# Patient Record
Sex: Male | Born: 1965
Health system: Southern US, Community
[De-identification: ages and names within clinical notes are randomized; demographics above are authoritative.]

## PROBLEM LIST (undated history)

## (undated) DIAGNOSIS — F32A Depression, unspecified: Secondary | ICD-10-CM

## (undated) DIAGNOSIS — Z87442 Personal history of urinary calculi: Secondary | ICD-10-CM

## (undated) DIAGNOSIS — F419 Anxiety disorder, unspecified: Secondary | ICD-10-CM

## (undated) DIAGNOSIS — E785 Hyperlipidemia, unspecified: Secondary | ICD-10-CM

## (undated) DIAGNOSIS — Z974 Presence of external hearing-aid: Secondary | ICD-10-CM

## (undated) HISTORY — PX: CHOLECYSTECTOMY: SHX55

## (undated) HISTORY — PX: TONSILLECTOMY: SUR1361

## (undated) HISTORY — PX: ADENOIDECTOMY: SUR15

---
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
## 2004-08-01 ENCOUNTER — Ambulatory Visit: Payer: Self-pay | Admitting: Urology

## 2005-09-12 ENCOUNTER — Ambulatory Visit: Payer: Self-pay | Admitting: Urology

## 2005-10-30 ENCOUNTER — Ambulatory Visit: Payer: Self-pay | Admitting: Urology

## 2005-12-07 ENCOUNTER — Ambulatory Visit: Payer: Self-pay | Admitting: Internal Medicine

## 2005-12-28 ENCOUNTER — Ambulatory Visit: Payer: Self-pay | Admitting: Internal Medicine

## 2006-01-11 ENCOUNTER — Ambulatory Visit: Payer: Self-pay | Admitting: Surgery

## 2006-01-29 ENCOUNTER — Emergency Department: Payer: Self-pay | Admitting: Emergency Medicine

## 2006-01-29 ENCOUNTER — Other Ambulatory Visit: Payer: Self-pay

## 2006-01-30 ENCOUNTER — Ambulatory Visit: Payer: Self-pay | Admitting: Emergency Medicine

## 2006-04-10 HISTORY — PX: CHOLECYSTECTOMY: SHX55

## 2007-07-21 ENCOUNTER — Ambulatory Visit: Payer: Self-pay | Admitting: Family Medicine

## 2007-08-10 ENCOUNTER — Ambulatory Visit: Payer: Self-pay | Admitting: Internal Medicine

## 2008-04-27 IMAGING — CR DG ABDOMEN 1V
1 series · 2 of 2 positions shown · non-contrast
Comparison: none

REASON FOR EXAM: Nephrolithiasis. Patient need films
COMMENTS:

[Series 1: view not recorded · 0.17mm/px · 2 of 2 slices shown]
[im 1/2]
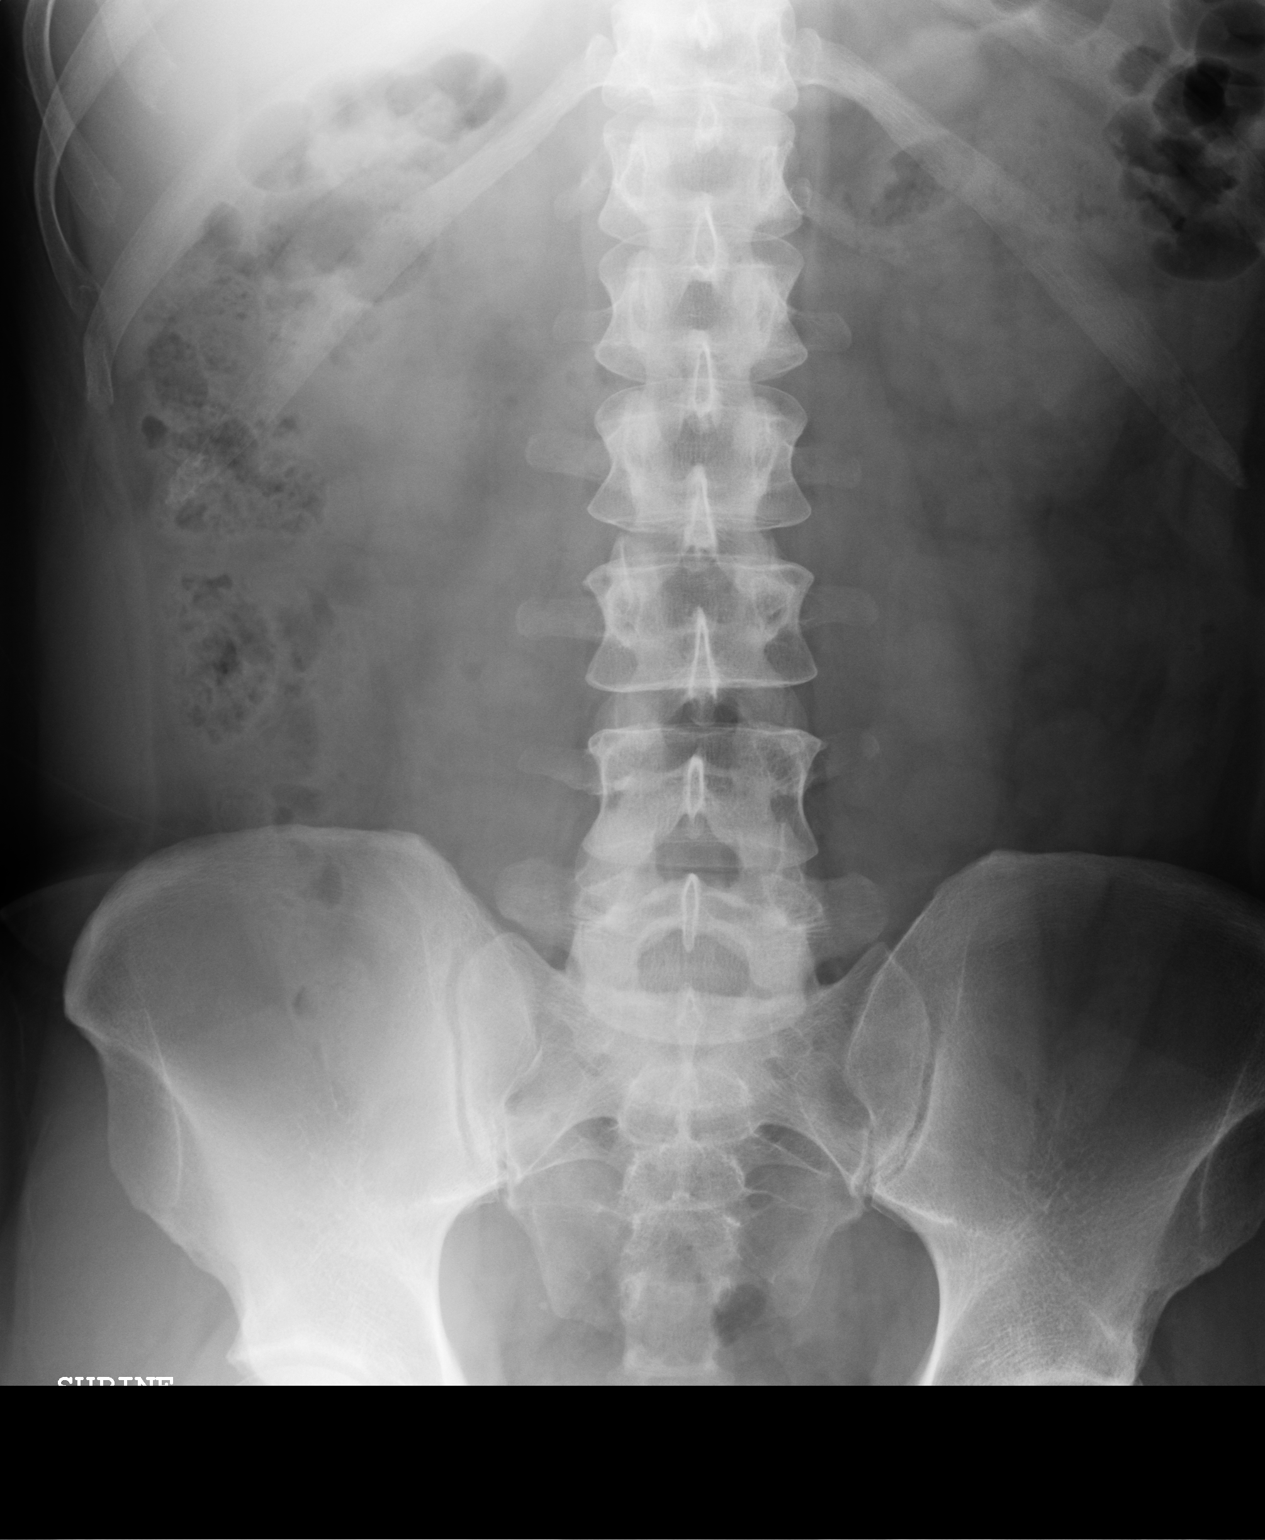
[im 2/2]
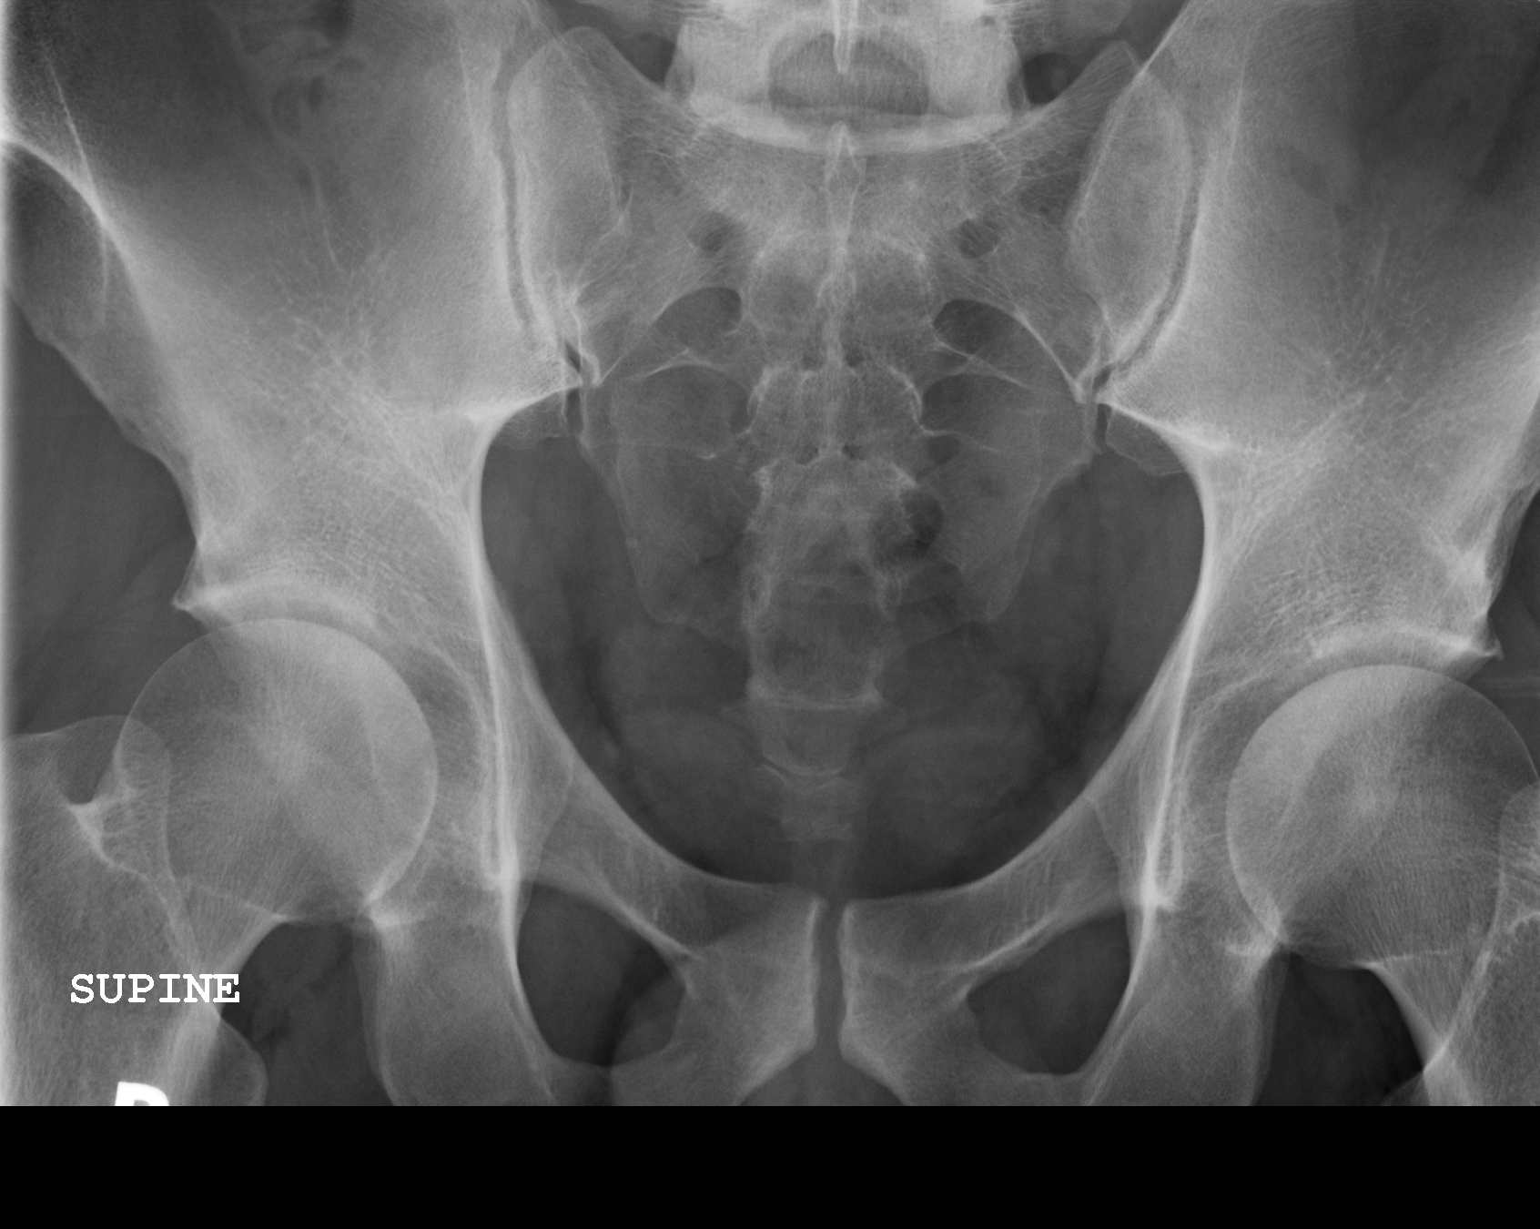

[2 of 2 positions shown; findings below may reference images not displayed]

PROCEDURE:     DXR - DXR KIDNEY URETER BLADDER  - October 30, 2005  [DATE]

RESULT:     AP view of the abdomen is compared to the prior exam of 09/12/05.
The bowel gas pattern is normal.  There is a moderate amount of fecal
material in the colon.  No definite renal or ureteral stones are identified
on plain film examination.
IMPRESSION: No specific abnormalities are identified.

## 2008-06-04 IMAGING — US ABDOMEN ULTRASOUND
1 series · 17 of 25 positions shown · non-contrast
Comparison: none

REASON FOR EXAM: RUQ abdominal pain
COMMENTS:

[Series 1: abdomen ultrasound · 17 of 48 slices shown]
[im 1/48]
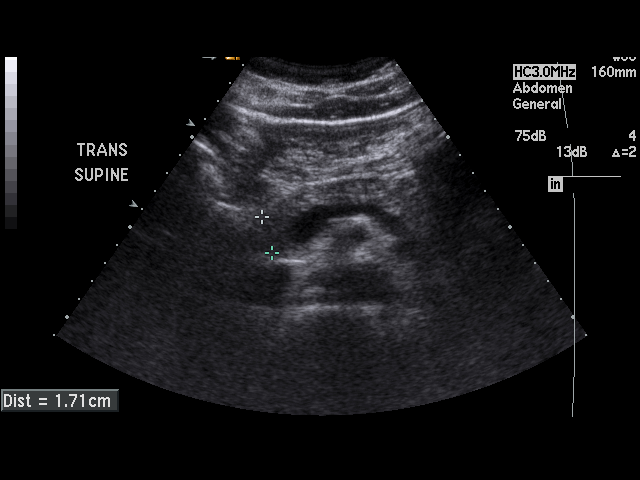
[im 4/48]
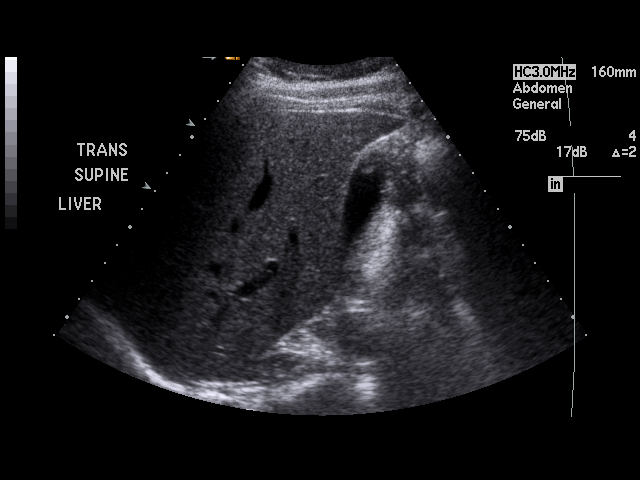
[im 6/48]
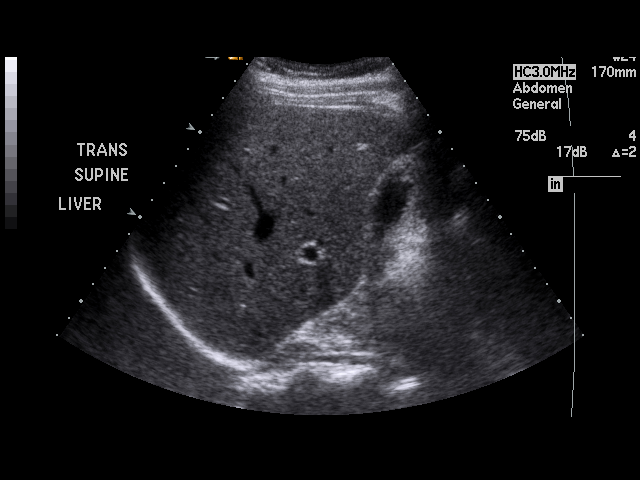
[im 10/48]
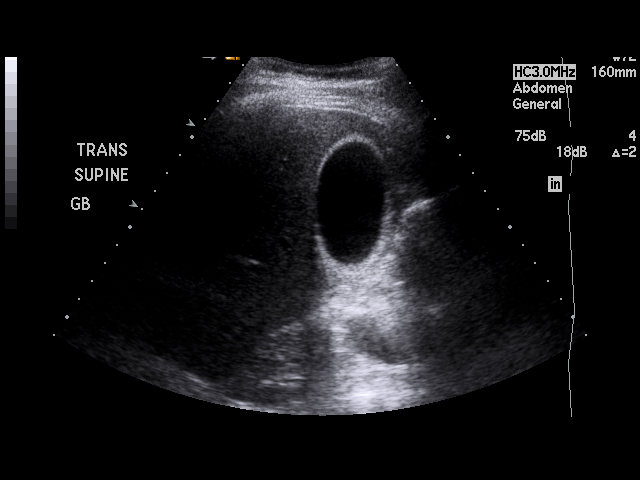
[im 12/48]
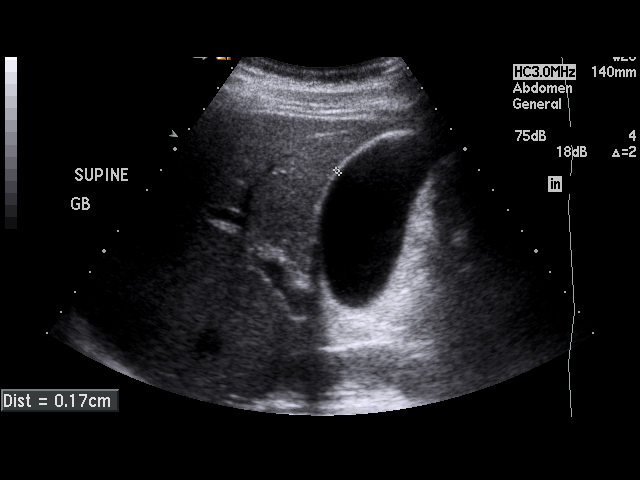
[im 16/48]
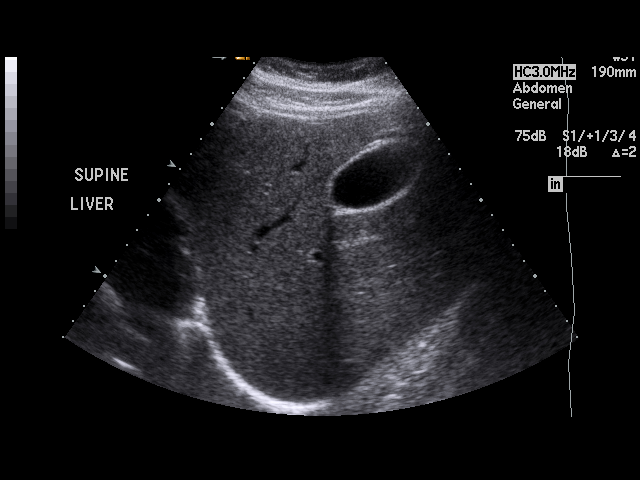
[im 18/48]
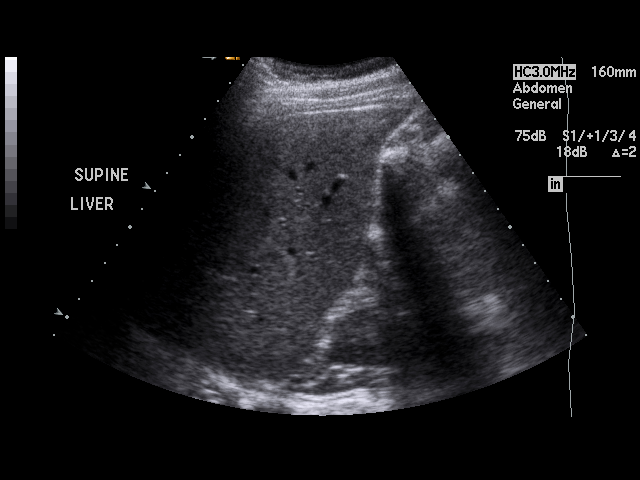
[im 22/48]
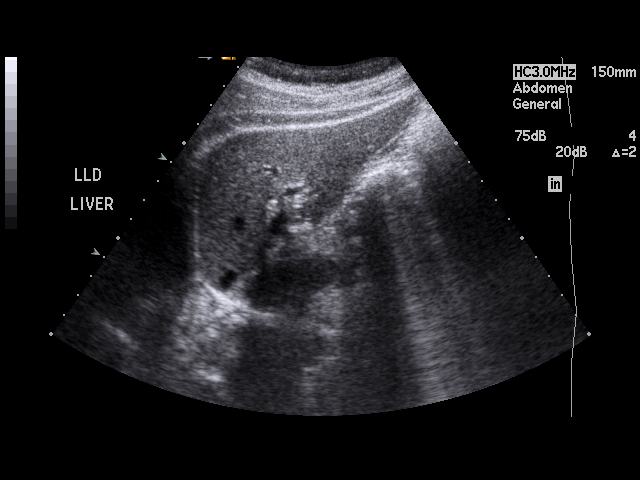
[im 24/48]
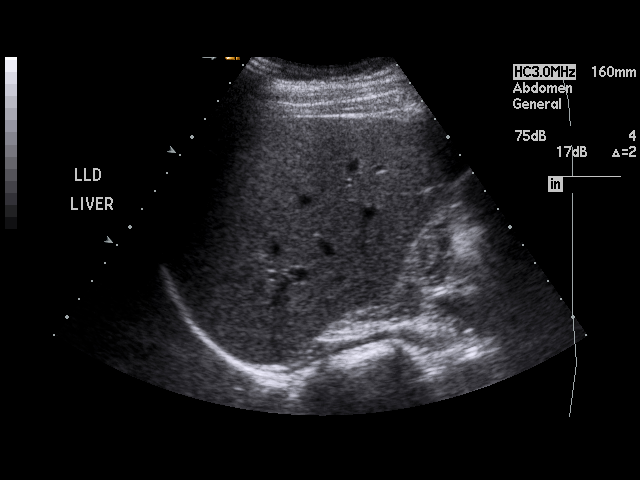
[im 26/48]
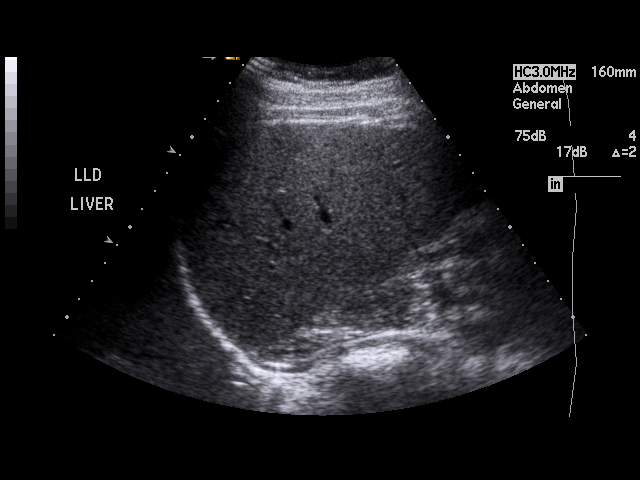
[im 30/48]
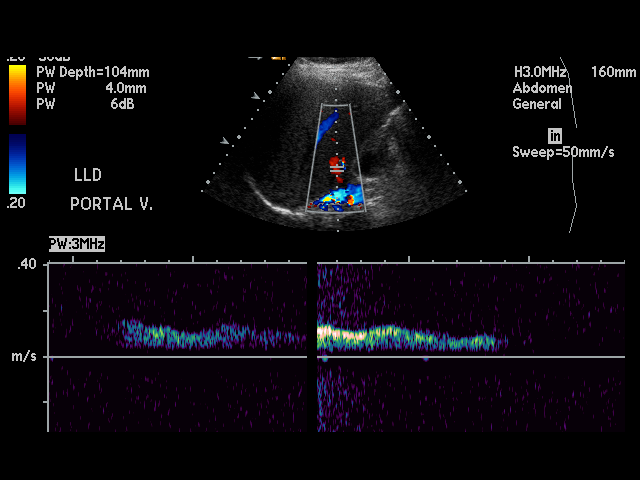
[im 32/48]
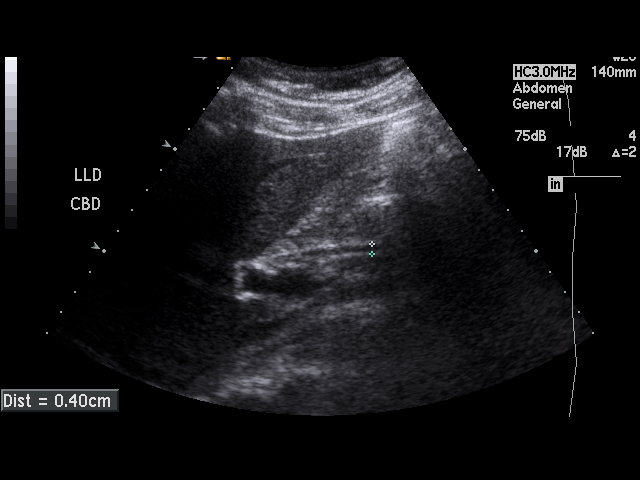
[im 36/48]
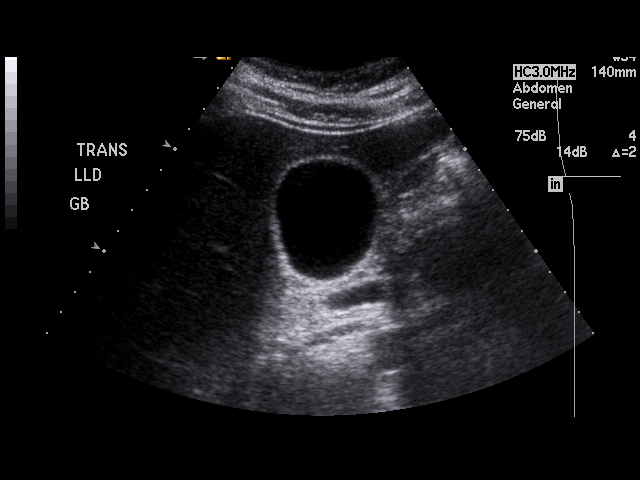
[im 38/48]
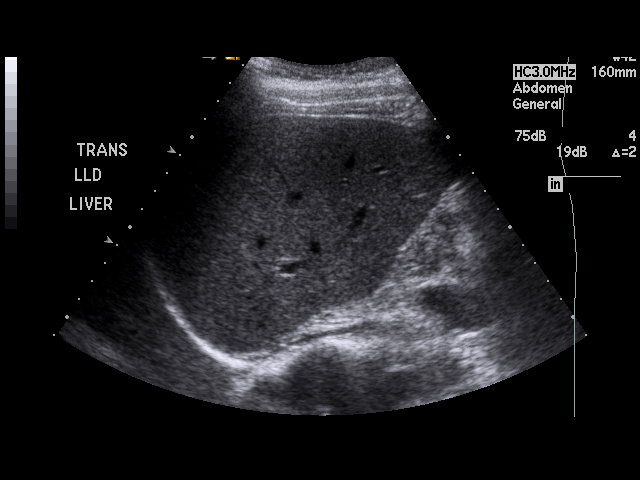
[im 42/48]
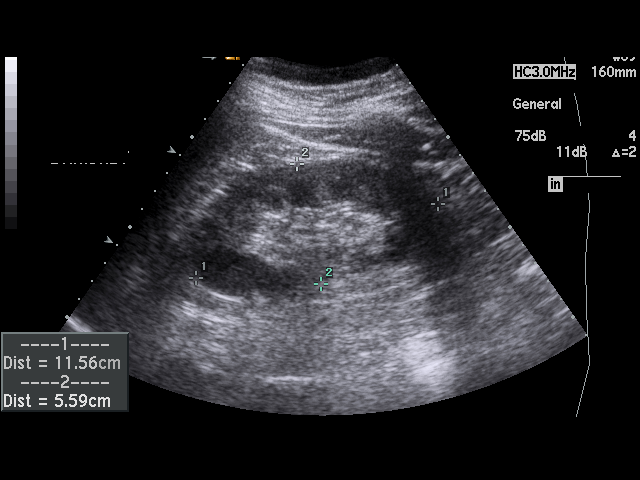
[im 44/48]
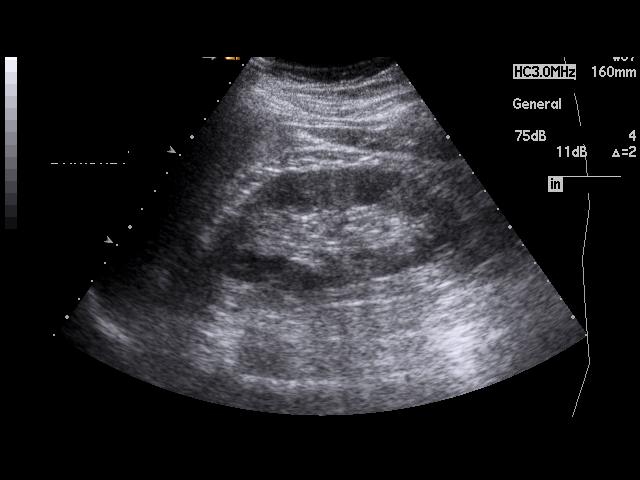
[im 48/48]
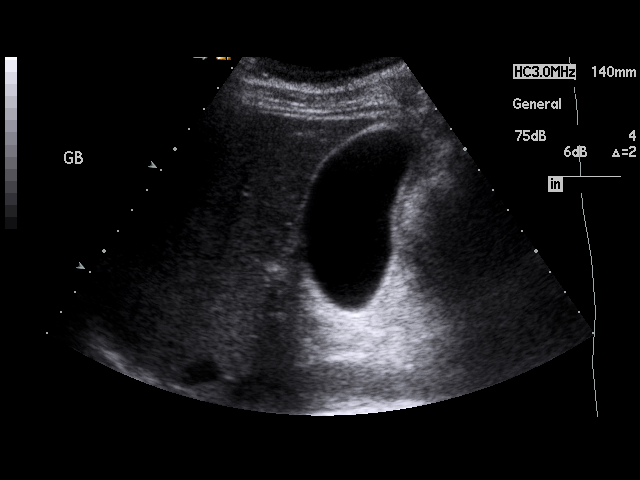

[17 of 25 positions shown; findings below may reference images not displayed]

PROCEDURE:     US  - US ABDOMEN GENERAL SURVEY  - December 07, 2005  [DATE]

RESULT:          The liver demonstrates a homogeneous echotexture.  The
common bile duct measures 4 mm in diameter.  There is no evidence of intra
or extrahepatic biliary ductal dilatation.  Hepatopetal flow is demonstrated
within the portal vein.  Evaluation of the gallbladder demonstrates no
evidence of pericholecystic fluids, stones, or sludging.  Gallbladder wall
thickness is 1.7 mm.  The RIGHT and LEFT kidneys demonstrate appropriate
corticomedullary differentiation without evidence of hydronephrosis, masses
or calculi.  The spleen is homogeneous in echotexture and measures 11.4 cm
in longitudinal dimensions.  The pancreas demonstrates a homogeneous
echotexture.  There is no evidence of abdominal aortic aneurysm.  The
maximal AP dimensions of the abdominal aorta are 1.5 cm.
IMPRESSION: Unremarkable abdominal ultrasound as described above.

## 2008-06-16 ENCOUNTER — Ambulatory Visit: Payer: Self-pay | Admitting: Internal Medicine

## 2008-07-21 ENCOUNTER — Ambulatory Visit: Payer: Self-pay | Admitting: Internal Medicine

## 2008-10-23 ENCOUNTER — Ambulatory Visit: Payer: Self-pay | Admitting: Urology

## 2009-01-15 ENCOUNTER — Ambulatory Visit: Payer: Self-pay | Admitting: Urology

## 2009-01-21 ENCOUNTER — Ambulatory Visit: Payer: Self-pay | Admitting: Urology

## 2009-11-10 ENCOUNTER — Ambulatory Visit: Payer: Self-pay | Admitting: General Practice

## 2011-10-03 ENCOUNTER — Ambulatory Visit: Payer: Self-pay | Admitting: Urology

## 2011-10-05 ENCOUNTER — Ambulatory Visit: Payer: Self-pay | Admitting: Urology

## 2012-03-11 ENCOUNTER — Other Ambulatory Visit: Payer: Self-pay | Admitting: Family

## 2012-06-06 ENCOUNTER — Ambulatory Visit: Payer: Self-pay | Admitting: Internal Medicine

## 2014-04-02 ENCOUNTER — Ambulatory Visit: Payer: Self-pay | Admitting: Physician Assistant

## 2014-04-02 LAB — DOT URINE DIP
Blood: NEGATIVE
Glucose,UR: NEGATIVE
PROTEIN: NEGATIVE
SPECIFIC GRAVITY: 1.015 (ref 1.000–1.030)

## 2014-07-08 ENCOUNTER — Encounter: Payer: Self-pay | Admitting: *Deleted

## 2015-06-16 DIAGNOSIS — F3342 Major depressive disorder, recurrent, in full remission: Secondary | ICD-10-CM | POA: Diagnosis not present

## 2015-11-03 DIAGNOSIS — H5213 Myopia, bilateral: Secondary | ICD-10-CM | POA: Diagnosis not present

## 2016-02-15 DIAGNOSIS — N200 Calculus of kidney: Secondary | ICD-10-CM | POA: Diagnosis not present

## 2016-02-15 DIAGNOSIS — J309 Allergic rhinitis, unspecified: Secondary | ICD-10-CM | POA: Diagnosis not present

## 2016-02-15 DIAGNOSIS — E785 Hyperlipidemia, unspecified: Secondary | ICD-10-CM | POA: Diagnosis not present

## 2016-02-21 DIAGNOSIS — E785 Hyperlipidemia, unspecified: Secondary | ICD-10-CM | POA: Diagnosis not present

## 2016-02-21 DIAGNOSIS — N200 Calculus of kidney: Secondary | ICD-10-CM | POA: Diagnosis not present

## 2016-02-22 DIAGNOSIS — F3342 Major depressive disorder, recurrent, in full remission: Secondary | ICD-10-CM | POA: Diagnosis not present

## 2016-03-29 ENCOUNTER — Ambulatory Visit
Admission: EM | Admit: 2016-03-29 | Discharge: 2016-03-29 | Disposition: A | Discharge status: Home / Self Care | Payer: Self-pay | Attending: Emergency Medicine | Admitting: Emergency Medicine

## 2016-03-29 ENCOUNTER — Encounter: Payer: Self-pay | Admitting: Emergency Medicine

## 2016-03-29 DIAGNOSIS — Z0289 Encounter for other administrative examinations: Secondary | ICD-10-CM

## 2016-03-29 LAB — DEPT OF TRANSP DIPSTICK, URINE (ARMC ONLY)
Glucose, UA: NEGATIVE mg/dL
Hgb urine dipstick: NEGATIVE
PROTEIN: NEGATIVE mg/dL
Specific Gravity, Urine: 1.02 (ref 1.005–1.030)

## 2016-03-29 NOTE — ED Provider Notes (Signed)
CSN: [id number]     Arrival date & time 03/29/16  0815 History   First MD Initiated Contact with Patient 03/29/16 [phone]     Chief Complaint  Patient presents with  . DOT Physical   (Consider location/radiation/quality/duration/timing/severity/associated sxs/prior Treatment) Patient presents today for DOT physical. No other complaints.       History reviewed. No pertinent past medical history. Past Surgical History:  Procedure Laterality Date  . ADENOIDECTOMY    . CHOLECYSTECTOMY    . TONSILLECTOMY     Family History  Problem Relation Age of Onset  . Hypertension Mother    Social History  Substance Use Topics  . Smoking status: Never Smoker  . Smokeless tobacco: Never Used  . Alcohol use No    Review of Systems  Constitutional:       See scanned DOT form    Allergies  Penicillins and Sulfa antibiotics  Home Medications   Prior to Admission medications   Not on File   Meds Ordered and Administered this Visit  Medications - No data to display  BP 101/69 (BP Location: Left Arm)   Pulse 83   Temp 98.4 F (36.9 C) (Oral)   Resp 16   Ht 6\' 1"  (1.854 m)   Wt 220 lb (99.8 kg)   SpO2 100%   BMI 29.03 kg/m  No data found.   Physical Exam  Constitutional: He is oriented to person, place, and time. He appears well-developed and well-nourished.  HENT:  Head: Normocephalic and atraumatic.  Right Ear: External ear normal.  Left Ear: External ear normal.  Nose: Nose normal.  Mouth/Throat: Oropharynx is clear and moist. No oropharyngeal exudate.  Eyes: Conjunctivae and EOM are normal. Pupils are equal, round, and reactive to light. Right eye exhibits no discharge. Left eye exhibits no discharge.  Neck: Normal range of motion.  Cardiovascular: Normal rate and normal heart sounds.   Pulmonary/Chest: Effort normal and breath sounds normal. No respiratory distress. He has no wheezes.  Abdominal: Soft. Bowel sounds are normal. He exhibits no distension. There is no  tenderness.  Musculoskeletal: Normal range of motion. He exhibits no edema or tenderness.  Lymphadenopathy:    He has no cervical adenopathy.  Neurological: He is alert and oriented to person, place, and time. No cranial nerve deficit. Coordination normal.  Skin: Skin is warm and dry.  Psychiatric: He has a normal mood and affect.  Nursing note and vitals reviewed.   Urgent Care Course   Clinical Course     Procedures (including critical care time)  Labs Review Labs Reviewed  DEPT OF TRANSP DIPSTICK, URINE([facility] ONLY)    Imaging Review No results found.   Visual Acuity Review  Right Eye Distance: 20/20 uncorrected Left Eye Distance: 20/20 uncorrected Bilateral Distance: 20/15 uncorrected  MDM   1. Encounter for examination required by Department of Transportation (DOT)    Physical examination normal with no abnormal finding. Patient meets the criteria for certification for 2 years.   DOT site is unfortunately down at the current moment. Medical form given to patient to take to the local DMV. Will upload patient's info to the DOT site once the site is back up again.     Barry Dienes, NP 03/29/16 [phone]

## 2016-03-29 NOTE — ED Triage Notes (Signed)
Patient here for DOT Physical.  

## 2016-06-20 DIAGNOSIS — F3342 Major depressive disorder, recurrent, in full remission: Secondary | ICD-10-CM | POA: Diagnosis not present

## 2016-10-02 DIAGNOSIS — F419 Anxiety disorder, unspecified: Secondary | ICD-10-CM | POA: Diagnosis not present

## 2016-10-02 DIAGNOSIS — F3342 Major depressive disorder, recurrent, in full remission: Secondary | ICD-10-CM | POA: Diagnosis not present

## 2016-10-05 DIAGNOSIS — C642 Malignant neoplasm of left kidney, except renal pelvis: Secondary | ICD-10-CM | POA: Diagnosis not present

## 2016-12-27 DIAGNOSIS — H903 Sensorineural hearing loss, bilateral: Secondary | ICD-10-CM | POA: Diagnosis not present

## 2017-02-13 DIAGNOSIS — E785 Hyperlipidemia, unspecified: Secondary | ICD-10-CM | POA: Diagnosis not present

## 2017-02-13 DIAGNOSIS — J309 Allergic rhinitis, unspecified: Secondary | ICD-10-CM | POA: Diagnosis not present

## 2017-02-13 DIAGNOSIS — N200 Calculus of kidney: Secondary | ICD-10-CM | POA: Diagnosis not present

## 2017-03-27 DIAGNOSIS — D485 Neoplasm of uncertain behavior of skin: Secondary | ICD-10-CM | POA: Diagnosis not present

## 2017-03-27 DIAGNOSIS — L821 Other seborrheic keratosis: Secondary | ICD-10-CM | POA: Diagnosis not present

## 2017-03-27 DIAGNOSIS — D2272 Melanocytic nevi of left lower limb, including hip: Secondary | ICD-10-CM | POA: Diagnosis not present

## 2017-03-27 DIAGNOSIS — X32XXXA Exposure to sunlight, initial encounter: Secondary | ICD-10-CM | POA: Diagnosis not present

## 2017-03-27 DIAGNOSIS — D239 Other benign neoplasm of skin, unspecified: Secondary | ICD-10-CM | POA: Diagnosis not present

## 2017-03-27 DIAGNOSIS — L570 Actinic keratosis: Secondary | ICD-10-CM | POA: Diagnosis not present

## 2017-03-27 DIAGNOSIS — D225 Melanocytic nevi of trunk: Secondary | ICD-10-CM | POA: Diagnosis not present

## 2017-03-27 DIAGNOSIS — D2261 Melanocytic nevi of right upper limb, including shoulder: Secondary | ICD-10-CM | POA: Diagnosis not present

## 2017-03-27 DIAGNOSIS — D044 Carcinoma in situ of skin of scalp and neck: Secondary | ICD-10-CM | POA: Diagnosis not present

## 2017-04-16 DIAGNOSIS — D049 Carcinoma in situ of skin, unspecified: Secondary | ICD-10-CM | POA: Diagnosis not present

## 2017-04-21 ENCOUNTER — Ambulatory Visit
Admission: EM | Admit: 2017-04-21 | Discharge: 2017-04-21 | Disposition: A | Discharge status: Home / Self Care | Payer: 59 | Attending: Family Medicine | Admitting: Family Medicine

## 2017-04-21 ENCOUNTER — Encounter: Payer: Self-pay | Admitting: Emergency Medicine

## 2017-04-21 ENCOUNTER — Other Ambulatory Visit: Payer: Self-pay

## 2017-04-21 DIAGNOSIS — R05 Cough: Secondary | ICD-10-CM | POA: Diagnosis not present

## 2017-04-21 DIAGNOSIS — R0981 Nasal congestion: Secondary | ICD-10-CM | POA: Diagnosis not present

## 2017-04-21 DIAGNOSIS — J069 Acute upper respiratory infection, unspecified: Secondary | ICD-10-CM | POA: Diagnosis not present

## 2017-04-21 DIAGNOSIS — J329 Chronic sinusitis, unspecified: Secondary | ICD-10-CM

## 2017-04-21 DIAGNOSIS — B9789 Other viral agents as the cause of diseases classified elsewhere: Secondary | ICD-10-CM

## 2017-04-21 LAB — RAPID INFLUENZA A&B ANTIGENS (ARMC ONLY)
INFLUENZA A (ARMC): NEGATIVE
INFLUENZA B (ARMC): NEGATIVE

## 2017-04-21 MED ORDER — PSEUDOEPH-BROMPHEN-DM 30-2-10 MG/5ML PO SYRP: 5.0000 mL | ORAL_SOLUTION | Freq: Four times a day (QID) | ORAL | 0 refills | Status: DC | PRN

## 2017-04-21 MED ORDER — LEVOFLOXACIN 500 MG PO TABS: 500.0000 mg | ORAL_TABLET | Freq: Every day | ORAL | 0 refills | Status: DC

## 2017-04-21 NOTE — Discharge Instructions (Signed)
Please make sure you are drinking lots of fluids.  You may continue with Tylenol and ibuprofen as needed for body aches.  Please take Bromfed cough /decongestant syrup as needed.  Continue with Mucinex.  Start Levaquin 500 mg daily.  If you begin to experience any increasing or worsening cough, fevers above 102.1 that are not going down with Tylenol or ibuprofen, shortness of breath, return to the clinic.

## 2017-04-21 NOTE — ED Provider Notes (Signed)
[facility]    CSN: [id number] Arrival date & time: 04/21/17  0940     History   Chief Complaint Chief Complaint  Patient presents with  . Cough    APPOINTMENT  . Nasal Congestion    HPI Darrell Kelley is a 52 y.o. male presents to the urgent care facility for evaluation of sinus pain, pressure, productive cough, body aches.  Symptoms been present since Thursday.  Symptoms were sudden onset.  Patient states he has chronic sinus congestion but over the last few days his sinus congestion has been much more severe with more pressure.  He has been taking Mucinex as well as Tylenol and ibuprofen with some relief of his symptoms.  He is sleeping well at nighttime with no significant cough.  Denies any fevers.  Has had some slight loose stools but no significant nausea, vomiting or frequent diarrhea.  He is tolerating fluids well.  No abdominal pain.  HPI  History reviewed. No pertinent past medical history.  There are no active problems to display for this patient.   Past Surgical History:  Procedure Laterality Date  . ADENOIDECTOMY    . CHOLECYSTECTOMY    . TONSILLECTOMY         Home Medications    Prior to Admission medications   Medication Sig Start Date End Date Taking? Authorizing Provider  brompheniramine-pseudoephedrine-DM 30-2-10 MG/5ML syrup Take 5 mLs by mouth 4 (four) times daily as needed. 04/21/17   Duanne Guess, PA-C  levofloxacin (LEVAQUIN) 500 MG tablet Take 1 tablet (500 mg total) by mouth daily. 04/21/17   Duanne Guess, PA-C    Family History Family History  Problem Relation Age of Onset  . Hypertension Mother     Social History Social History   Tobacco Use  . Smoking status: Never Smoker  . Smokeless tobacco: Never Used  Substance Use Topics  . Alcohol use: No  . Drug use: No     Allergies   Penicillins and Sulfa antibiotics   Review of Systems Review of Systems  Constitutional: Negative.  Negative for activity  change, appetite change, chills and fever.  HENT: Positive for congestion, sinus pressure and sinus pain. Negative for ear pain, mouth sores, rhinorrhea, sore throat and trouble swallowing.   Eyes: Negative for photophobia, pain and discharge.  Respiratory: Positive for cough. Negative for chest tightness, shortness of breath, wheezing and stridor.   Cardiovascular: Negative for chest pain and leg swelling.  Gastrointestinal: Negative for abdominal distention, abdominal pain, diarrhea, nausea and vomiting.  Genitourinary: Negative for difficulty urinating and dysuria.  Musculoskeletal: Positive for myalgias. Negative for arthralgias, back pain, gait problem and neck stiffness.  Skin: Negative for color change and rash.  Neurological: Negative for dizziness and headaches.  Hematological: Negative for adenopathy.  Psychiatric/Behavioral: Negative for agitation and behavioral problems.     Physical Exam Triage Vital Signs ED Triage Vitals  Enc Vitals Group     BP 04/21/17 0952 120/81     Pulse Rate 04/21/17 0952 (!) 107     Resp 04/21/17 0952 16     Temp 04/21/17 0952 98.5 F (36.9 C)     Temp Source 04/21/17 0952 Oral     SpO2 04/21/17 0952 98 %     Weight 04/21/17 0953 215 lb (97.5 kg)     Height 04/21/17 0953 6' (1.829 m)     Head Circumference --      Peak Flow --      Pain Score  04/21/17 0959 0     Pain Loc --      Pain Edu? --      Excl. in [place]? --    No data found.  Updated Vital Signs BP 120/81 (BP Location: Left Arm)   Pulse (!) 107   Temp 98.5 F (36.9 C) (Oral)   Resp 16   Ht 6' (1.829 m)   Wt 215 lb (97.5 kg)   SpO2 98%   BMI 29.16 kg/m   Visual Acuity Right Eye Distance:   Left Eye Distance:   Bilateral Distance:    Right Eye Near:   Left Eye Near:    Bilateral Near:     Physical Exam  Constitutional: He appears well-developed and well-nourished.  HENT:  Head: Normocephalic and atraumatic.  Mouth/Throat: No oropharyngeal exudate.  Mild maxillary  and frontal sinus tenderness.  Eyes: Conjunctivae are normal.  Neck: Normal range of motion. Neck supple.  Cardiovascular: Normal rate and regular rhythm.  No murmur heard. Pulmonary/Chest: Effort normal and breath sounds normal. No stridor. No respiratory distress. He has no wheezes. He has no rales.  Abdominal: Soft. There is no tenderness.  Musculoskeletal: He exhibits no edema.  Lymphadenopathy:    He has no cervical adenopathy.  Neurological: He is alert.  Skin: Skin is warm and dry.  Psychiatric: He has a normal mood and affect.  Nursing note and vitals reviewed.    UC Treatments / Results  Labs (all labs ordered are listed, but only abnormal results are displayed) Labs Reviewed  RAPID INFLUENZA A&B ANTIGENS ([place])    EKG  EKG Interpretation None       Radiology No results found.  Procedures Procedures (including critical care time)  Medications Ordered in UC Medications - No data to display   Initial Impression / Assessment and Plan / UC Course  I have reviewed the triage vital signs and the nursing notes.  Pertinent labs & imaging results that were available during my care of the patient were reviewed by me and considered in my medical decision making (see chart for details).     52 year old male with history of sinus congestion that is significantly increased over the last few days.  He has had other symptoms consistent with viral upper respiratory infection, influenza swab negative.  Patient will be treated for sinusitis with Levaquin.  He is given other medications such as Bromfed to help with symptomatic relief.  He will continue with Mucinex.  He will continue to increase fluids.  He is educated on signs and symptoms to follow-up for.  Final Clinical Impressions(s) / UC Diagnoses   Final diagnoses:  Viral upper respiratory tract infection  Chronic sinusitis, unspecified location    ED Discharge Orders        Ordered    levofloxacin  (LEVAQUIN) 500 MG tablet  Daily     04/21/17 1035    brompheniramine-pseudoephedrine-DM 30-2-10 MG/5ML syrup  4 times daily PRN     04/21/17 1035        Duanne Guess, [place] 04/21/17 1038

## 2017-04-21 NOTE — ED Triage Notes (Signed)
Patient c/o sinus congestion, nasal congestion, cough, and chest congestion since Thursday.  Patient denies fevers.

## 2017-05-24 DIAGNOSIS — Z79899 Other long term (current) drug therapy: Secondary | ICD-10-CM | POA: Diagnosis not present

## 2017-05-24 DIAGNOSIS — F419 Anxiety disorder, unspecified: Secondary | ICD-10-CM | POA: Diagnosis not present

## 2017-05-24 DIAGNOSIS — F3342 Major depressive disorder, recurrent, in full remission: Secondary | ICD-10-CM | POA: Diagnosis not present

## 2017-09-04 DIAGNOSIS — H524 Presbyopia: Secondary | ICD-10-CM | POA: Diagnosis not present

## 2017-09-11 DIAGNOSIS — D044 Carcinoma in situ of skin of scalp and neck: Secondary | ICD-10-CM | POA: Diagnosis not present

## 2017-09-11 DIAGNOSIS — Z85828 Personal history of other malignant neoplasm of skin: Secondary | ICD-10-CM | POA: Diagnosis not present

## 2017-09-11 DIAGNOSIS — L821 Other seborrheic keratosis: Secondary | ICD-10-CM | POA: Diagnosis not present

## 2017-09-11 DIAGNOSIS — D2262 Melanocytic nevi of left upper limb, including shoulder: Secondary | ICD-10-CM | POA: Diagnosis not present

## 2017-09-11 DIAGNOSIS — D2272 Melanocytic nevi of left lower limb, including hip: Secondary | ICD-10-CM | POA: Diagnosis not present

## 2017-09-11 DIAGNOSIS — D2271 Melanocytic nevi of right lower limb, including hip: Secondary | ICD-10-CM | POA: Diagnosis not present

## 2017-09-11 DIAGNOSIS — D2261 Melanocytic nevi of right upper limb, including shoulder: Secondary | ICD-10-CM | POA: Diagnosis not present

## 2017-09-11 DIAGNOSIS — Z08 Encounter for follow-up examination after completed treatment for malignant neoplasm: Secondary | ICD-10-CM | POA: Diagnosis not present

## 2017-09-11 DIAGNOSIS — D225 Melanocytic nevi of trunk: Secondary | ICD-10-CM | POA: Diagnosis not present

## 2017-09-26 DIAGNOSIS — F419 Anxiety disorder, unspecified: Secondary | ICD-10-CM | POA: Diagnosis not present

## 2017-09-26 DIAGNOSIS — F3342 Major depressive disorder, recurrent, in full remission: Secondary | ICD-10-CM | POA: Diagnosis not present

## 2017-10-16 DIAGNOSIS — R42 Dizziness and giddiness: Secondary | ICD-10-CM | POA: Diagnosis not present

## 2018-03-13 DIAGNOSIS — E785 Hyperlipidemia, unspecified: Secondary | ICD-10-CM | POA: Diagnosis not present

## 2018-03-13 DIAGNOSIS — J309 Allergic rhinitis, unspecified: Secondary | ICD-10-CM | POA: Diagnosis not present

## 2018-03-13 DIAGNOSIS — N200 Calculus of kidney: Secondary | ICD-10-CM | POA: Diagnosis not present

## 2018-03-13 DIAGNOSIS — Z125 Encounter for screening for malignant neoplasm of prostate: Secondary | ICD-10-CM | POA: Diagnosis not present

## 2018-03-18 DIAGNOSIS — N200 Calculus of kidney: Secondary | ICD-10-CM | POA: Diagnosis not present

## 2018-03-18 DIAGNOSIS — R972 Elevated prostate specific antigen [PSA]: Secondary | ICD-10-CM | POA: Diagnosis not present

## 2018-03-18 DIAGNOSIS — E785 Hyperlipidemia, unspecified: Secondary | ICD-10-CM | POA: Diagnosis not present

## 2018-03-26 DIAGNOSIS — F419 Anxiety disorder, unspecified: Secondary | ICD-10-CM | POA: Diagnosis not present

## 2018-03-26 DIAGNOSIS — F3340 Major depressive disorder, recurrent, in remission, unspecified: Secondary | ICD-10-CM | POA: Diagnosis not present

## 2018-09-19 DIAGNOSIS — F3340 Major depressive disorder, recurrent, in remission, unspecified: Secondary | ICD-10-CM | POA: Diagnosis not present

## 2018-09-19 DIAGNOSIS — F419 Anxiety disorder, unspecified: Secondary | ICD-10-CM | POA: Diagnosis not present

## 2018-11-25 DIAGNOSIS — F3340 Major depressive disorder, recurrent, in remission, unspecified: Secondary | ICD-10-CM | POA: Diagnosis not present

## 2018-11-25 DIAGNOSIS — F419 Anxiety disorder, unspecified: Secondary | ICD-10-CM | POA: Diagnosis not present

## 2018-12-09 DIAGNOSIS — R42 Dizziness and giddiness: Secondary | ICD-10-CM | POA: Diagnosis not present

## 2018-12-10 ENCOUNTER — Other Ambulatory Visit: Payer: Self-pay

## 2018-12-10 ENCOUNTER — Other Ambulatory Visit: Payer: Self-pay | Admitting: Family Medicine

## 2018-12-10 DIAGNOSIS — Z20822 Contact with and (suspected) exposure to covid-19: Secondary | ICD-10-CM

## 2018-12-10 DIAGNOSIS — R42 Dizziness and giddiness: Secondary | ICD-10-CM

## 2018-12-11 LAB — NOVEL CORONAVIRUS, NAA: SARS-CoV-2, NAA: NOT DETECTED

## 2018-12-13 ENCOUNTER — Other Ambulatory Visit: Payer: Self-pay

## 2018-12-13 ENCOUNTER — Ambulatory Visit
Admission: RE | Admit: 2018-12-13 | Discharge: 2018-12-13 | Disposition: A | Discharge status: Home / Self Care | Payer: 59 | Source: Ambulatory Visit | Attending: Family Medicine | Admitting: Family Medicine

## 2018-12-13 DIAGNOSIS — R42 Dizziness and giddiness: Secondary | ICD-10-CM | POA: Diagnosis not present

## 2019-01-27 DIAGNOSIS — F3340 Major depressive disorder, recurrent, in remission, unspecified: Secondary | ICD-10-CM | POA: Diagnosis not present

## 2019-01-27 DIAGNOSIS — F419 Anxiety disorder, unspecified: Secondary | ICD-10-CM | POA: Diagnosis not present

## 2019-05-28 DIAGNOSIS — F3340 Major depressive disorder, recurrent, in remission, unspecified: Secondary | ICD-10-CM | POA: Diagnosis not present

## 2019-05-28 DIAGNOSIS — F419 Anxiety disorder, unspecified: Secondary | ICD-10-CM | POA: Diagnosis not present

## 2019-05-30 DIAGNOSIS — Z20822 Contact with and (suspected) exposure to covid-19: Secondary | ICD-10-CM | POA: Diagnosis not present

## 2019-05-30 DIAGNOSIS — Z03818 Encounter for observation for suspected exposure to other biological agents ruled out: Secondary | ICD-10-CM | POA: Diagnosis not present

## 2019-06-18 DIAGNOSIS — E785 Hyperlipidemia, unspecified: Secondary | ICD-10-CM | POA: Diagnosis not present

## 2019-06-18 DIAGNOSIS — Z1211 Encounter for screening for malignant neoplasm of colon: Secondary | ICD-10-CM | POA: Diagnosis not present

## 2019-06-18 DIAGNOSIS — N200 Calculus of kidney: Secondary | ICD-10-CM | POA: Diagnosis not present

## 2019-06-18 DIAGNOSIS — J309 Allergic rhinitis, unspecified: Secondary | ICD-10-CM | POA: Diagnosis not present

## 2019-06-23 ENCOUNTER — Other Ambulatory Visit: Payer: Self-pay

## 2019-06-23 DIAGNOSIS — Z1211 Encounter for screening for malignant neoplasm of colon: Secondary | ICD-10-CM

## 2019-06-26 DIAGNOSIS — E785 Hyperlipidemia, unspecified: Secondary | ICD-10-CM | POA: Diagnosis not present

## 2019-06-26 DIAGNOSIS — Z125 Encounter for screening for malignant neoplasm of prostate: Secondary | ICD-10-CM | POA: Diagnosis not present

## 2019-07-21 ENCOUNTER — Other Ambulatory Visit: Payer: Self-pay

## 2019-07-21 ENCOUNTER — Encounter: Payer: Self-pay | Admitting: Gastroenterology

## 2019-07-24 ENCOUNTER — Other Ambulatory Visit: Payer: Self-pay

## 2019-07-24 ENCOUNTER — Other Ambulatory Visit
Admission: RE | Admit: 2019-07-24 | Discharge: 2019-07-24 | Disposition: A | Discharge status: Home / Self Care | Payer: 59 | Source: Ambulatory Visit | Attending: Gastroenterology | Admitting: Gastroenterology

## 2019-07-24 DIAGNOSIS — Z01812 Encounter for preprocedural laboratory examination: Secondary | ICD-10-CM | POA: Diagnosis not present

## 2019-07-24 DIAGNOSIS — Z20822 Contact with and (suspected) exposure to covid-19: Secondary | ICD-10-CM | POA: Insufficient documentation

## 2019-07-24 LAB — SARS CORONAVIRUS 2 (TAT 6-24 HRS): SARS Coronavirus 2: NEGATIVE

## 2019-07-25 NOTE — Discharge Instructions (Signed)
General Anesthesia, Adult, Care After This sheet gives you information about how to care for yourself after your procedure. Your health care provider may also give you more specific instructions. If you have problems or questions, contact your health care provider. What can I expect after the procedure? After the procedure, the following side effects are common:  Pain or discomfort at the IV site.  Nausea.  Vomiting.  Sore throat.  Trouble concentrating.  Feeling cold or chills.  Weak or tired.  Sleepiness and fatigue.  Soreness and body aches. These side effects can affect parts of the body that were not involved in surgery. Follow these instructions at home:  For at least 24 hours after the procedure:  Have a responsible adult stay with you. It is important to have someone help care for you until you are awake and alert.  Rest as needed.  Do not: ? Participate in activities in which you could fall or become injured. ? Drive. ? Use heavy machinery. ? Drink alcohol. ? Take sleeping pills or medicines that cause drowsiness. ? Make important decisions or sign legal documents. ? Take care of children on your own. Eating and drinking  Follow any instructions from your health care provider about eating or drinking restrictions.  When you feel hungry, start by eating small amounts of foods that are soft and easy to digest (bland), such as toast. Gradually return to your regular diet.  Drink enough fluid to keep your urine pale yellow.  If you vomit, rehydrate by drinking water, juice, or clear broth. General instructions  If you have sleep apnea, surgery and certain medicines can increase your risk for breathing problems. Follow instructions from your health care provider about wearing your sleep device: ? Anytime you are sleeping, including during daytime naps. ? While taking prescription pain medicines, sleeping medicines, or medicines that make you drowsy.  Return to  your normal activities as told by your health care provider. Ask your health care provider what activities are safe for you.  Take over-the-counter and prescription medicines only as told by your health care provider.  If you smoke, do not smoke without supervision.  Keep all follow-up visits as told by your health care provider. This is important. Contact a health care provider if:  You have nausea or vomiting that does not get better with medicine.  You cannot eat or drink without vomiting.  You have pain that does not get better with medicine.  You are unable to pass urine.  You develop a skin rash.  You have a fever.  You have redness around your IV site that gets worse. Get help right away if:  You have difficulty breathing.  You have chest pain.  You have blood in your urine or stool, or you vomit blood. Summary  After the procedure, it is common to have a sore throat or nausea. It is also common to feel tired.  Have a responsible adult stay with you for the first 24 hours after general anesthesia. It is important to have someone help care for you until you are awake and alert.  When you feel hungry, start by eating small amounts of foods that are soft and easy to digest (bland), such as toast. Gradually return to your regular diet.  Drink enough fluid to keep your urine pale yellow.  Return to your normal activities as told by your health care provider. Ask your health care provider what activities are safe for you. This information is not   intended to replace advice given to you by your health care provider. Make sure you discuss any questions you have with your health care provider. Document Revised: 03/30/2017 Document Reviewed: 11/10/2016 Elsevier Patient Education  2020 Elsevier Inc.  

## 2019-07-28 ENCOUNTER — Other Ambulatory Visit: Payer: Self-pay

## 2019-07-28 ENCOUNTER — Ambulatory Visit: Payer: 59 | Admitting: Anesthesiology

## 2019-07-28 ENCOUNTER — Encounter: Payer: Self-pay | Admitting: Gastroenterology

## 2019-07-28 ENCOUNTER — Ambulatory Visit
Admission: RE | Admit: 2019-07-28 | Discharge: 2019-07-28 | Disposition: A | Discharge status: Home / Self Care | Payer: 59 | Attending: Gastroenterology | Admitting: Gastroenterology

## 2019-07-28 ENCOUNTER — Encounter
Admission: RE | Disposition: A | Discharge status: Home / Self Care | Payer: Self-pay | Source: Home / Self Care | Attending: Gastroenterology

## 2019-07-28 DIAGNOSIS — Z79899 Other long term (current) drug therapy: Secondary | ICD-10-CM | POA: Diagnosis not present

## 2019-07-28 DIAGNOSIS — K640 First degree hemorrhoids: Secondary | ICD-10-CM | POA: Insufficient documentation

## 2019-07-28 DIAGNOSIS — Z1211 Encounter for screening for malignant neoplasm of colon: Secondary | ICD-10-CM

## 2019-07-28 DIAGNOSIS — K633 Ulcer of intestine: Secondary | ICD-10-CM | POA: Diagnosis not present

## 2019-07-28 DIAGNOSIS — K626 Ulcer of anus and rectum: Secondary | ICD-10-CM | POA: Diagnosis not present

## 2019-07-28 HISTORY — DX: Personal history of urinary calculi: Z87.442

## 2019-07-28 HISTORY — DX: Presence of external hearing-aid: Z97.4

## 2019-07-28 HISTORY — PX: COLONOSCOPY WITH PROPOFOL: SHX5780

## 2019-07-28 SURGERY — COLONOSCOPY WITH PROPOFOL
Anesthesia: General | Site: Rectum

## 2019-07-28 MED ORDER — LIDOCAINE HCL (CARDIAC) PF 100 MG/5ML IV SOSY
PREFILLED_SYRINGE | INTRAVENOUS | Status: DC | PRN
  Administered 2019-07-28: 50 mg via INTRAVENOUS

## 2019-07-28 MED ORDER — STERILE WATER FOR IRRIGATION IR SOLN
Status: DC | PRN
  Administered 2019-07-28: 50 mL

## 2019-07-28 MED ORDER — LACTATED RINGERS IV SOLN: INTRAVENOUS | Status: DC

## 2019-07-28 MED ORDER — ACETAMINOPHEN 325 MG PO TABS: 325.0000 mg | ORAL_TABLET | ORAL | Status: DC | PRN

## 2019-07-28 MED ORDER — ACETAMINOPHEN 160 MG/5ML PO SOLN: 325.0000 mg | ORAL | Status: DC | PRN

## 2019-07-28 MED ORDER — PROPOFOL 10 MG/ML IV BOLUS
INTRAVENOUS | Status: DC | PRN
  Administered 2019-07-28 (×2): 200 mg via INTRAVENOUS

## 2019-07-28 MED ORDER — ONDANSETRON HCL 4 MG/2ML IJ SOLN: 4.0000 mg | Freq: Once | INTRAMUSCULAR | Status: DC | PRN

## 2019-07-28 SURGICAL SUPPLY — 6 items
CANISTER SUCT 1200ML W/VALVE (MISCELLANEOUS) ×2 IMPLANT
FORCEPS BIOP RAD 4 LRG CAP 4 (CUTTING FORCEPS) ×2 IMPLANT
GOWN CVR UNV OPN BCK APRN NK (MISCELLANEOUS) ×2 IMPLANT
GOWN ISOL THUMB LOOP REG UNIV (MISCELLANEOUS) ×2
KIT ENDO PROCEDURE OLY (KITS) ×2 IMPLANT
WATER STERILE IRR 250ML POUR (IV SOLUTION) ×2 IMPLANT

## 2019-07-28 NOTE — Anesthesia Postprocedure Evaluation (Signed)
Anesthesia Post Note  Patient: Darrell Kelley  Procedure(s) Performed: COLONOSCOPY WITH BIOPSY (N/A Rectum)     Patient location during evaluation: PACU Anesthesia Type: General Level of consciousness: awake and alert Pain management: pain level controlled Vital Signs Assessment: post-procedure vital signs reviewed and stable Respiratory status: spontaneous breathing, nonlabored ventilation, respiratory function stable and patient connected to nasal cannula oxygen Cardiovascular status: blood pressure returned to baseline and stable Postop Assessment: no apparent nausea or vomiting Anesthetic complications: no    Adele Barthel Fiora Weill

## 2019-07-28 NOTE — H&P (Signed)
Lucilla Lame, MD [address] [place], [place] [address] Phone: [phone] Fax : [phone]  Primary Care Physician:  Sofie Hartigan, MD Primary Gastroenterologist:  Dr. Allen Norris  Pre-Procedure History & Physical: HPI:  Darrell Kelley is a 54 y.o. male is here for a screening colonoscopy.   Past Medical History:  Diagnosis Date  . History of kidney stones   . Wears hearing aid in both ears     Past Surgical History:  Procedure Laterality Date  . ADENOIDECTOMY    . CHOLECYSTECTOMY    . TONSILLECTOMY      Prior to Admission medications   Medication Sig Start Date End Date Taking? Authorizing Provider  escitalopram (LEXAPRO) 5 MG tablet Take 5 mg by mouth daily.   Yes [provider]  sertraline (ZOLOFT) 100 MG tablet Take 200 mg by mouth daily.   Yes [provider]  traZODone (DESYREL) 50 MG tablet Take 50 mg by mouth at bedtime.   Yes [provider]  EPINEPHrine 0.3 mg/0.3 mL IJ SOAJ injection Inject 0.3 mg into the muscle as needed for anaphylaxis.    [provider]  meclizine (ANTIVERT) 25 MG tablet Take 25 mg by mouth 3 (three) times daily as needed for dizziness.    [provider]    Allergies as of 06/23/2019 - Review Complete 04/21/2017  Allergen Reaction Noted  . Penicillins Hives 03/29/2016  . Sulfa antibiotics Hives 03/29/2016    Family History  Problem Relation Age of Onset  . Hypertension Mother     Social History   Socioeconomic History  . Marital status: Married    Spouse name: Not on file  . Number of children: Not on file  . Years of education: Not on file  . Highest education level: Not on file  Occupational History  . Not on file  Tobacco Use  . Smoking status: Never Smoker  . Smokeless tobacco: Never Used  Substance and Sexual Activity  . Alcohol use: No  . Drug use: No  . Sexual activity: Not on file  Other Topics Concern  . Not on file  Social History  Narrative  . Not on file   Social Determinants of Health   Financial Resource Strain:   . Difficulty of Paying Living Expenses:   Food Insecurity:   . Worried About Charity fundraiser in the Last Year:   . Arboriculturist in the Last Year:   Transportation Needs:   . Film/video editor (Medical):   Marland Kitchen Lack of Transportation (Non-Medical):   Physical Activity:   . Days of Exercise per Week:   . Minutes of Exercise per Session:   Stress:   . Feeling of Stress :   Social Connections:   . Frequency of Communication with Friends and Family:   . Frequency of Social Gatherings with Friends and Family:   . Attends Religious Services:   . Active Member of Clubs or Organizations:   . Attends Archivist Meetings:   Marland Kitchen Marital Status:   Intimate Partner Violence:   . Fear of Current or Ex-Partner:   . Emotionally Abused:   Marland Kitchen Physically Abused:   . Sexually Abused:     Review of Systems: See HPI, otherwise negative ROS  Physical Exam: BP (!) 123/91   Pulse 82   Temp 97.9 F (36.6 C) (Temporal)   Resp 16   Ht 6' (1.829 m)   Wt 98.9 kg   SpO2  100%   BMI 29.57 kg/m  General:   Alert,  pleasant and cooperative in NAD Head:  Normocephalic and atraumatic. Neck:  Supple; no masses or thyromegaly. Lungs:  Clear throughout to auscultation.    Heart:  Regular rate and rhythm. Abdomen:  Soft, nontender and nondistended. Normal bowel sounds, without guarding, and without rebound.   Neurologic:  Alert and  oriented x4;  grossly normal neurologically.  Impression/Plan: Darrell Kelley is now here to undergo a screening colonoscopy.  Risks, benefits, and alternatives regarding colonoscopy have been reviewed with the patient.  Questions have been answered.  All parties agreeable.

## 2019-07-28 NOTE — Op Note (Signed)
[facility]astroenter[facility] Patient Name: Darrell Kelley Procedure Date: 07/28/2019 9:58 AM MRN: [id number] Account #: [id number] Date of Birth: 1965/11/26 Admit Type: Outpatient Age: 54 Room: [facility] OR ROOM 01 Gender: Male Note Status: Finalized Procedure:             Colonoscopy Indications:           Screening for colorectal malignant neoplasm Providers:             Lucilla Lame MD, MD Referring MD:          Sofie Hartigan (Referring MD) Medicines:             Propofol per Anesthesia Complications:         No immediate complications. Procedure:             Pre-Anesthesia Assessment:                        - Prior to the procedure, a History and Physical was                         performed, and patient medications and allergies were                         reviewed. The patient's tolerance of previous                         anesthesia was also reviewed. The risks and benefits                         of the procedure and the sedation options and risks                         were discussed with the patient. All questions were                         answered, and informed consent was obtained. Prior                         Anticoagulants: The patient has taken no previous                         anticoagulant or antiplatelet agents. ASA Grade                         Assessment: II - A patient with mild systemic disease.                         After reviewing the risks and benefits, the patient                         was deemed in satisfactory condition to undergo the                         procedure.                        After obtaining informed consent, the colonoscope was  passed under direct vision. Throughout the procedure,                         the patient's blood pressure, pulse, and oxygen                         saturations were monitored continuously. The                         Colonoscope was introduced through the  anus and                         advanced to the the cecum, identified by appendiceal                         orifice and ileocecal valve. The colonoscopy was                         performed without difficulty. The patient tolerated                         the procedure well. The quality of the bowel                         preparation was good. Findings:      The perianal and digital rectal examinations were normal.      Non-bleeding internal hemorrhoids were found during retroflexion. The       hemorrhoids were Grade I (internal hemorrhoids that do not prolapse).      Nonbleeding ulcerated mucosa with no stigmata of recent bleeding were       present in the rectum. Biopsies were taken with a cold forceps for       histology. Impression:            - Non-bleeding internal hemorrhoids.                        - Mucosal ulceration. Biopsied. Recommendation:        - Discharge patient to home.                        - Resume previous diet.                        - Continue present medications.                        - Await pathology results. Procedure Code(s):     --- Professional ---                        [phone], Colonoscopy, flexible; with biopsy, single or                         multiple Diagnosis Code(s):     --- Professional ---                        Z12.11, Encounter for screening for malignant neoplasm                         of colon  K63.3, Ulcer of intestine CPT copyright 2019 American Medical Association. All rights reserved. The codes documented in this report are preliminary and upon coder review may  be revised to meet current compliance requirements. Lucilla Lame MD, MD 07/28/2019 10:22:21 AM This report has been signed electronically. Number of Addenda: 0 Note Initiated On: 07/28/2019 9:58 AM Scope Withdrawal Time: 0 hours 10 minutes 22 seconds  Total Procedure Duration: 0 hours 14 minutes 18 seconds  Estimated Blood Loss:  Estimated blood  loss: none.      [facility]

## 2019-07-28 NOTE — Anesthesia Preprocedure Evaluation (Addendum)
Anesthesia Evaluation  Patient identified by MRN, date of birth, ID band Patient awake    History of Anesthesia Complications Negative for: history of anesthetic complications  Airway Mallampati: III  TM Distance: >3 FB Neck ROM: Full    Dental no notable dental hx.    Pulmonary neg pulmonary ROS,    Pulmonary exam normal        Cardiovascular Exercise Tolerance: Good negative cardio ROS Normal cardiovascular exam     Neuro/Psych negative neurological ROS  negative psych ROS   GI/Hepatic negative GI ROS, Neg liver ROS,   Endo/Other  negative endocrine ROS  Renal/GU negative Renal ROS     Musculoskeletal negative musculoskeletal ROS (+)   Abdominal   Peds  Hematology negative hematology ROS (+)   Anesthesia Other Findings   Reproductive/Obstetrics                            Anesthesia Physical Anesthesia Plan  ASA: I  Anesthesia Plan: General   Post-op Pain Management:    Induction: Intravenous  PONV Risk Score and Plan: 2 and Propofol infusion and TIVA  Airway Management Planned: Nasal Cannula and Natural Airway  Additional Equipment: None  Intra-op Plan:   Post-operative Plan:   Informed Consent: I have reviewed the patients History and Physical, chart, labs and discussed the procedure including the risks, benefits and alternatives for the proposed anesthesia with the patient or authorized representative who has indicated his/her understanding and acceptance.       Plan Discussed with: CRNA  Anesthesia Plan Comments: (Patient reports recent procedure under propofol without difficulty)      Anesthesia Quick Evaluation

## 2019-07-28 NOTE — Transfer of Care (Addendum)
Immediate Anesthesia Transfer of Care Note  Patient: Darrell Kelley  Procedure(s) Performed: COLONOSCOPY WITH BIOPSY (N/A Rectum)  Patient Location: PACU  Anesthesia Type: General  Level of Consciousness: awake, alert  and patient cooperative  Airway and Oxygen Therapy: Patient Spontanous Breathing and Patient connected to supplemental oxygen  Post-op Assessment: Post-op Vital signs reviewed, Patient's Cardiovascular Status Stable, Respiratory Function Stable, Patent Airway and No signs of Nausea or vomiting  Post-op Vital Signs: Reviewed and stable  Complications: No apparent anesthesia complications

## 2019-07-29 ENCOUNTER — Encounter: Payer: Self-pay | Admitting: *Deleted

## 2019-07-29 ENCOUNTER — Encounter: Payer: Self-pay | Admitting: Gastroenterology

## 2019-07-29 LAB — SURGICAL PATHOLOGY

## 2019-08-07 DIAGNOSIS — H5213 Myopia, bilateral: Secondary | ICD-10-CM | POA: Diagnosis not present

## 2019-08-20 DIAGNOSIS — F419 Anxiety disorder, unspecified: Secondary | ICD-10-CM | POA: Diagnosis not present

## 2019-08-20 DIAGNOSIS — F3340 Major depressive disorder, recurrent, in remission, unspecified: Secondary | ICD-10-CM | POA: Diagnosis not present

## 2019-12-24 DIAGNOSIS — E785 Hyperlipidemia, unspecified: Secondary | ICD-10-CM | POA: Diagnosis not present

## 2019-12-24 DIAGNOSIS — N200 Calculus of kidney: Secondary | ICD-10-CM | POA: Diagnosis not present

## 2019-12-24 DIAGNOSIS — J309 Allergic rhinitis, unspecified: Secondary | ICD-10-CM | POA: Diagnosis not present

## 2019-12-24 DIAGNOSIS — R109 Unspecified abdominal pain: Secondary | ICD-10-CM | POA: Diagnosis not present

## 2020-01-01 ENCOUNTER — Other Ambulatory Visit: Payer: Self-pay | Admitting: Family Medicine

## 2020-01-01 DIAGNOSIS — R109 Unspecified abdominal pain: Secondary | ICD-10-CM

## 2020-01-01 DIAGNOSIS — N200 Calculus of kidney: Secondary | ICD-10-CM

## 2020-01-08 ENCOUNTER — Ambulatory Visit
Admission: RE | Admit: 2020-01-08 | Discharge: 2020-01-08 | Disposition: A | Discharge status: Home / Self Care | Payer: 59 | Source: Ambulatory Visit | Attending: Family Medicine | Admitting: Family Medicine

## 2020-01-08 ENCOUNTER — Other Ambulatory Visit: Payer: Self-pay

## 2020-01-08 DIAGNOSIS — R109 Unspecified abdominal pain: Secondary | ICD-10-CM | POA: Diagnosis not present

## 2020-01-08 DIAGNOSIS — N200 Calculus of kidney: Secondary | ICD-10-CM | POA: Diagnosis not present

## 2020-01-08 DIAGNOSIS — R161 Splenomegaly, not elsewhere classified: Secondary | ICD-10-CM | POA: Diagnosis not present

## 2020-01-08 DIAGNOSIS — Z9049 Acquired absence of other specified parts of digestive tract: Secondary | ICD-10-CM | POA: Diagnosis not present

## 2020-01-08 DIAGNOSIS — N281 Cyst of kidney, acquired: Secondary | ICD-10-CM | POA: Diagnosis not present

## 2020-01-19 DIAGNOSIS — N281 Cyst of kidney, acquired: Secondary | ICD-10-CM | POA: Diagnosis not present

## 2020-01-19 DIAGNOSIS — N23 Unspecified renal colic: Secondary | ICD-10-CM | POA: Diagnosis not present

## 2020-01-19 DIAGNOSIS — N200 Calculus of kidney: Secondary | ICD-10-CM | POA: Diagnosis not present

## 2020-01-19 DIAGNOSIS — M5489 Other dorsalgia: Secondary | ICD-10-CM | POA: Diagnosis not present

## 2020-01-21 ENCOUNTER — Other Ambulatory Visit: Payer: Self-pay | Admitting: Urology

## 2020-01-21 DIAGNOSIS — N281 Cyst of kidney, acquired: Secondary | ICD-10-CM

## 2020-01-28 ENCOUNTER — Other Ambulatory Visit: Payer: Self-pay

## 2020-01-28 ENCOUNTER — Ambulatory Visit
Admission: RE | Admit: 2020-01-28 | Discharge: 2020-01-28 | Disposition: A | Discharge status: Home / Self Care | Payer: 59 | Source: Ambulatory Visit | Attending: Urology | Admitting: Urology

## 2020-01-28 DIAGNOSIS — N281 Cyst of kidney, acquired: Secondary | ICD-10-CM | POA: Diagnosis not present

## 2020-02-02 DIAGNOSIS — N500 Atrophy of testis: Secondary | ICD-10-CM | POA: Diagnosis not present

## 2020-02-02 DIAGNOSIS — R232 Flushing: Secondary | ICD-10-CM | POA: Diagnosis not present

## 2020-02-02 DIAGNOSIS — N200 Calculus of kidney: Secondary | ICD-10-CM | POA: Diagnosis not present

## 2020-02-02 DIAGNOSIS — N281 Cyst of kidney, acquired: Secondary | ICD-10-CM | POA: Diagnosis not present

## 2020-02-03 ENCOUNTER — Other Ambulatory Visit
Admission: RE | Admit: 2020-02-03 | Discharge: 2020-02-03 | Disposition: A | Discharge status: Home / Self Care | Payer: 59 | Attending: Urology | Admitting: Urology

## 2020-02-03 DIAGNOSIS — Z01812 Encounter for preprocedural laboratory examination: Secondary | ICD-10-CM | POA: Insufficient documentation

## 2020-02-04 LAB — TESTOSTERONE,FREE AND TOTAL
Testosterone, Free: 9.1 pg/mL (ref 7.2–24.0)
Testosterone: 251 ng/dL — ABNORMAL LOW (ref 264–916)

## 2020-02-04 LAB — FSH/LH
FSH: 2.1 m[IU]/mL (ref 1.5–12.4)
LH: 4.1 m[IU]/mL (ref 1.7–8.6)

## 2020-02-06 ENCOUNTER — Other Ambulatory Visit
Admission: RE | Admit: 2020-02-06 | Discharge: 2020-02-06 | Disposition: A | Discharge status: Home / Self Care | Payer: 59 | Attending: Urology | Admitting: Urology

## 2020-02-06 DIAGNOSIS — E291 Testicular hypofunction: Secondary | ICD-10-CM | POA: Diagnosis not present

## 2020-02-07 LAB — TESTOSTERONE: Testosterone: 175 ng/dL — ABNORMAL LOW (ref 264–916)

## 2020-03-15 DIAGNOSIS — B029 Zoster without complications: Secondary | ICD-10-CM | POA: Diagnosis not present

## 2020-05-06 DIAGNOSIS — F419 Anxiety disorder, unspecified: Secondary | ICD-10-CM | POA: Diagnosis not present

## 2020-05-06 DIAGNOSIS — F3340 Major depressive disorder, recurrent, in remission, unspecified: Secondary | ICD-10-CM | POA: Diagnosis not present

## 2020-05-18 DIAGNOSIS — R310 Gross hematuria: Secondary | ICD-10-CM | POA: Diagnosis not present

## 2020-08-26 DIAGNOSIS — F419 Anxiety disorder, unspecified: Secondary | ICD-10-CM | POA: Diagnosis not present

## 2020-08-26 DIAGNOSIS — F3340 Major depressive disorder, recurrent, in remission, unspecified: Secondary | ICD-10-CM | POA: Diagnosis not present

## 2020-09-13 DIAGNOSIS — E291 Testicular hypofunction: Secondary | ICD-10-CM | POA: Diagnosis not present

## 2020-09-13 DIAGNOSIS — Z79899 Other long term (current) drug therapy: Secondary | ICD-10-CM | POA: Diagnosis not present

## 2020-09-13 DIAGNOSIS — N200 Calculus of kidney: Secondary | ICD-10-CM | POA: Diagnosis not present

## 2020-09-13 DIAGNOSIS — N401 Enlarged prostate with lower urinary tract symptoms: Secondary | ICD-10-CM | POA: Diagnosis not present

## 2020-09-13 DIAGNOSIS — N281 Cyst of kidney, acquired: Secondary | ICD-10-CM | POA: Diagnosis not present

## 2020-12-31 DIAGNOSIS — Z23 Encounter for immunization: Secondary | ICD-10-CM | POA: Diagnosis not present

## 2020-12-31 DIAGNOSIS — J309 Allergic rhinitis, unspecified: Secondary | ICD-10-CM | POA: Diagnosis not present

## 2020-12-31 DIAGNOSIS — Z0000 Encounter for general adult medical examination without abnormal findings: Secondary | ICD-10-CM | POA: Diagnosis not present

## 2020-12-31 DIAGNOSIS — E785 Hyperlipidemia, unspecified: Secondary | ICD-10-CM | POA: Diagnosis not present

## 2020-12-31 DIAGNOSIS — N200 Calculus of kidney: Secondary | ICD-10-CM | POA: Diagnosis not present

## 2021-01-05 DIAGNOSIS — Z0000 Encounter for general adult medical examination without abnormal findings: Secondary | ICD-10-CM | POA: Diagnosis not present

## 2021-01-05 DIAGNOSIS — Z13220 Encounter for screening for lipoid disorders: Secondary | ICD-10-CM | POA: Diagnosis not present

## 2021-02-03 DIAGNOSIS — F3340 Major depressive disorder, recurrent, in remission, unspecified: Secondary | ICD-10-CM | POA: Diagnosis not present

## 2021-02-03 DIAGNOSIS — F419 Anxiety disorder, unspecified: Secondary | ICD-10-CM | POA: Diagnosis not present

## 2021-03-21 DIAGNOSIS — E291 Testicular hypofunction: Secondary | ICD-10-CM | POA: Diagnosis not present

## 2021-03-21 DIAGNOSIS — Z79899 Other long term (current) drug therapy: Secondary | ICD-10-CM | POA: Diagnosis not present

## 2021-03-21 DIAGNOSIS — N200 Calculus of kidney: Secondary | ICD-10-CM | POA: Diagnosis not present

## 2021-03-21 DIAGNOSIS — N281 Cyst of kidney, acquired: Secondary | ICD-10-CM | POA: Diagnosis not present

## 2021-03-22 DIAGNOSIS — Z79899 Other long term (current) drug therapy: Secondary | ICD-10-CM | POA: Diagnosis not present

## 2021-03-22 DIAGNOSIS — N401 Enlarged prostate with lower urinary tract symptoms: Secondary | ICD-10-CM | POA: Diagnosis not present

## 2021-03-22 DIAGNOSIS — E291 Testicular hypofunction: Secondary | ICD-10-CM | POA: Diagnosis not present

## 2021-04-01 ENCOUNTER — Other Ambulatory Visit: Payer: Self-pay

## 2021-04-01 MED ORDER — TESTOSTERONE 20.25 MG/ACT (1.62%) TD GEL
TRANSDERMAL | 5 refills | Status: DC
Start: 2021-04-01 — End: 2021-11-29
  Filled 2021-04-01 – 2021-04-06 (×2): qty 75, 60d supply, fill #0
  Filled 2021-04-13 – 2021-04-14 (×2): qty 75, 30d supply, fill #0
  Filled 2021-05-24: qty 75, 60d supply, fill #1
  Filled 2021-06-06: qty 75, 30d supply, fill #1
  Filled 2021-09-21 (×2): qty 75, 60d supply, fill #2

## 2021-04-06 ENCOUNTER — Other Ambulatory Visit: Payer: Self-pay

## 2021-04-08 DIAGNOSIS — J0100 Acute maxillary sinusitis, unspecified: Secondary | ICD-10-CM | POA: Diagnosis not present

## 2021-04-13 ENCOUNTER — Other Ambulatory Visit: Payer: Self-pay

## 2021-04-14 ENCOUNTER — Other Ambulatory Visit: Payer: Self-pay

## 2021-05-24 ENCOUNTER — Other Ambulatory Visit: Payer: Self-pay

## 2021-06-06 ENCOUNTER — Other Ambulatory Visit: Payer: Self-pay

## 2021-06-10 ENCOUNTER — Other Ambulatory Visit: Payer: Self-pay

## 2021-07-04 DIAGNOSIS — E785 Hyperlipidemia, unspecified: Secondary | ICD-10-CM | POA: Diagnosis not present

## 2021-07-04 DIAGNOSIS — E291 Testicular hypofunction: Secondary | ICD-10-CM | POA: Diagnosis not present

## 2021-07-04 DIAGNOSIS — N200 Calculus of kidney: Secondary | ICD-10-CM | POA: Diagnosis not present

## 2021-07-04 DIAGNOSIS — J309 Allergic rhinitis, unspecified: Secondary | ICD-10-CM | POA: Diagnosis not present

## 2021-07-21 ENCOUNTER — Ambulatory Visit
Admission: RE | Admit: 2021-07-21 | Discharge: 2021-07-21 | Disposition: A | Discharge status: Home / Self Care | Payer: 59 | Source: Ambulatory Visit | Attending: Emergency Medicine | Admitting: Emergency Medicine

## 2021-07-21 ENCOUNTER — Ambulatory Visit (INDEPENDENT_AMBULATORY_CARE_PROVIDER_SITE_OTHER): Payer: 59

## 2021-07-21 VITALS — BP 121/87 | HR 86 | Temp 98.4°F | Resp 19

## 2021-07-21 DIAGNOSIS — N200 Calculus of kidney: Secondary | ICD-10-CM

## 2021-07-21 DIAGNOSIS — R109 Unspecified abdominal pain: Secondary | ICD-10-CM

## 2021-07-21 LAB — URINALYSIS, ROUTINE W REFLEX MICROSCOPIC
Bilirubin Urine: NEGATIVE
Glucose, UA: NEGATIVE mg/dL
Hgb urine dipstick: NEGATIVE
Leukocytes,Ua: NEGATIVE
Nitrite: NEGATIVE
Protein, ur: NEGATIVE mg/dL
Specific Gravity, Urine: 1.015 (ref 1.005–1.030)
pH: 5.5 (ref 5.0–8.0)

## 2021-07-21 MED ORDER — HYDROCODONE-ACETAMINOPHEN 5-325 MG PO TABS: 1.0000 | ORAL_TABLET | Freq: Four times a day (QID) | ORAL | 0 refills | Status: DC | PRN

## 2021-07-21 MED ORDER — ONDANSETRON 8 MG PO TBDP: ORAL_TABLET | ORAL | 0 refills | Status: DC

## 2021-07-21 MED ORDER — TAMSULOSIN HCL 0.4 MG PO CAPS: 0.4000 mg | ORAL_CAPSULE | Freq: Every day | ORAL | 0 refills | Status: AC

## 2021-07-21 NOTE — ED Provider Notes (Signed)
HPI ? ?SUBJECTIVE: ? ?Darrell Kelley is a 56 y.o. male who presents with 1 week of deep, constant, waxing and waning right back pain that radiates down his flank into his groin.  He reports nausea when the pain is severe.  He reports some odorous urine, but no dysuria, urgency, frequency, cloudy urine, hematuria.  He states that he feels as if he is able to completely empty his bladder.  No vomiting, fevers, back swelling.  He states this is identical to previous nephrolithiasis.  He has 2 known small kidney stones in his right kidney.  However, he also has a bone spur in the thoracic region and is wondering if his pain is being caused by this.  He has been t alternating Tylenol and ibuprofen, pushing fluids with improvement in symptoms.  Symptoms are worse when the medications wear off.  He has a past medical history of nonobstructive nephrolithiasis.  No history of pyelonephritis, UTI.  He has follow-up with his urologist on Monday. ? ? ?Past Medical History:  ?Diagnosis Date  ? History of kidney stones   ? Wears hearing aid in both ears   ? ? ?Past Surgical History:  ?Procedure Laterality Date  ? ADENOIDECTOMY    ? CHOLECYSTECTOMY    ? COLONOSCOPY WITH PROPOFOL N/A 07/28/2019  ? Procedure: COLONOSCOPY WITH BIOPSY;  Surgeon: Lucilla Lame, MD;  Location: [facility];  Service: Endoscopy;  Laterality: N/A;  ? TONSILLECTOMY    ? ? ?Family History  ?Problem Relation Age of Onset  ? Hypertension Mother   ? ? ?Social History  ? ?Tobacco Use  ? Smoking status: Never  ? Smokeless tobacco: Never  ?Vaping Use  ? Vaping Use: Never used  ?Substance Use Topics  ? Alcohol use: No  ? Drug use: No  ? ? ?No current facility-administered medications for this encounter. ? ?Current Outpatient Medications:  ?  HYDROcodone-acetaminophen (NORCO/VICODIN) 5-325 MG tablet, Take 1-2 tablets by mouth every 6 (six) hours as needed for moderate pain., Disp: 12 tablet, Rfl: 0 ?  ondansetron (ZOFRAN-ODT) 8 MG disintegrating tablet,  1/2- 1 tablet q 8 hr prn nausea, vomiting, Disp: 20 tablet, Rfl: 0 ?  tamsulosin (FLOMAX) 0.4 MG CAPS capsule, Take 1 capsule (0.4 mg total) by mouth at bedtime for 7 days., Disp: 7 capsule, Rfl: 0 ?  EPINEPHrine 0.3 mg/0.3 mL IJ SOAJ injection, Inject 0.3 mg into the muscle as needed for anaphylaxis., Disp: , Rfl:  ?  escitalopram (LEXAPRO) 5 MG tablet, Take 5 mg by mouth daily., Disp: , Rfl:  ?  meclizine (ANTIVERT) 25 MG tablet, Take 25 mg by mouth 3 (three) times daily as needed for dizziness., Disp: , Rfl:  ?  sertraline (ZOLOFT) 100 MG tablet, Take 200 mg by mouth daily., Disp: , Rfl:  ?  Testosterone 20.25 MG/ACT (1.62%) GEL, Use 1 pump (20.'25mg'$ ) daily, Disp: 75 g, Rfl: 5 ?  traZODone (DESYREL) 50 MG tablet, Take 50 mg by mouth at bedtime., Disp: , Rfl:  ? ?Allergies  ?Allergen Reactions  ? Egg [Eggs Or Egg-Derived Products] Anaphylaxis  ?  With large amounts  ? Penicillins Hives  ? Sulfa Antibiotics Hives  ? Wheat Bran Diarrhea  ? ? ? ?ROS ? ?As noted in HPI.  ? ?Physical Exam ? ?BP 121/87   Pulse 86   Temp 98.4 ?F (36.9 ?C)   Resp 19   SpO2 98%  ? ?Constitutional: Well developed, well nourished, no acute distress ?Eyes:  EOMI, conjunctiva normal bilaterally ?HENT:  Normocephalic, atraumatic,mucus membranes moist ?Respiratory: Normal inspiratory effort ?Cardiovascular: Normal rate ?GI: nondistended, soft.  No suprapubic, flank tenderness. ?Back: No CVAT ?skin: No rash, skin intact ?Musculoskeletal: no deformities ?Neurologic: Alert & oriented x 3, no focal neuro deficits ?Psychiatric: Speech and behavior appropriate ? ? ?ED Course ? ? ?Medications - No data to display ? ?Orders Placed This Encounter  ?Procedures  ? DG Abd 1 View  ?  Standing Status:   Standing  ?  Number of Occurrences:   1  ?  Order Specific Question:   Reason for Exam (SYMPTOM  OR DIAGNOSIS REQUIRED)  ?  Answer:   History of right-sided nephrolithiasis, right flank pain, rule out obstructing nephrolithiasis  ? Urinalysis, Routine w  reflex microscopic  ?  Standing Status:   Standing  ?  Number of Occurrences:   1  ? ? ?Results for orders placed or performed during the hospital encounter of 07/21/21 (from the past 24 hour(s))  ?Urinalysis, Routine w reflex microscopic     Status: Abnormal  ? Collection Time: 07/21/21 11:16 AM  ?Result Value Ref Range  ? Color, Urine YELLOW YELLOW  ? APPearance CLEAR CLEAR  ? Specific Gravity, Urine 1.015 1.005 - 1.030  ? pH 5.5 5.0 - 8.0  ? Glucose, UA NEGATIVE NEGATIVE mg/dL  ? Hgb urine dipstick NEGATIVE NEGATIVE  ? Bilirubin Urine NEGATIVE NEGATIVE  ? Ketones, ur TRACE (A) NEGATIVE mg/dL  ? Protein, ur NEGATIVE NEGATIVE mg/dL  ? Nitrite NEGATIVE NEGATIVE  ? Leukocytes,Ua NEGATIVE NEGATIVE  ? ?DG Abd 1 View ? ?Result Date: 07/21/2021 ?CLINICAL DATA:  Right flank pain, history of kidney stones EXAM: ABDOMEN - 1 VIEW COMPARISON:  Prior abdominal radiograph 10/03/2011; prior CT scan of the abdomen and pelvis 01/08/2020 FINDINGS: The bowel gas pattern is normal. No radio-opaque calculi or other significant radiographic abnormality are seen. Small vascular phleboliths project over the right aspect of the anatomic pelvis. The largest demonstrates no interval change dating back to June of 2013. IMPRESSION: Negative. Electronically Signed   By: Jacqulynn Cadet M.D.   On: 07/21/2021 12:18   ? ?ED Clinical Impression ? ?1. Nephrolithiasis   ?  ? ?ED Assessment/Plan ? ?Presentation consistent with nephrolithiasis.  No evidence of UTI.  Will check KUB.  CT not available today.  Patient declined nausea, pain medication. ? ?Reviewed imaging independently.  No radiopaque calculi seen.  see radiology report for full details. ? ?[place] Narcotic database reviewed for this patient, and feel that the risk/benefit ratio today is favorable for proceeding with a prescription for controlled substance. ? ?Patient has no evidence of urinary obstruction, or obstructing calculi on KUB.  He has ibuprofen and Tylenol and a strainer at home.   Sending home with Zofran, Flomax and Norco.  He has follow-up with his urologist in 4 days.  ER return precautions given.  ? ?Discussed labs, imaging, MDM, treatment plan, and plan for follow-up with patient. Discussed sn/sx that should prompt return to the ED. patient agrees with plan.  ? ?Meds ordered this encounter  ?Medications  ? HYDROcodone-acetaminophen (NORCO/VICODIN) 5-325 MG tablet  ?  Sig: Take 1-2 tablets by mouth every 6 (six) hours as needed for moderate pain.  ?  Dispense:  12 tablet  ?  Refill:  0  ? tamsulosin (FLOMAX) 0.4 MG CAPS capsule  ?  Sig: Take 1 capsule (0.4 mg total) by mouth at bedtime for 7 days.  ?  Dispense:  7 capsule  ?  Refill:  0  ?  ondansetron (ZOFRAN-ODT) 8 MG disintegrating tablet  ?  Sig: 1/2- 1 tablet q 8 hr prn nausea, vomiting  ?  Dispense:  20 tablet  ?  Refill:  0  ? ? ? ? ?*This clinic note was created using [employer]. Therefore, there may be occasional mistakes despite careful proofreading. ? ?? ? ?  ?Melynda Ripple, MD ?07/21/21 1231 ? ?

## 2021-07-21 NOTE — Discharge Instructions (Addendum)
Your x-ray was negative for radiopaque calculi.  However, you could still be having stones.  Continue pushing plenty extra fluids.  Strain your urine.  Take the ibuprofen and Tylenol together 3-4 times a day.  Zofran for nausea, Norco for severe pain, Flomax.  Not take Tylenol and Norco-take 1 or the other.  I hope that you feel better soon.  It was great seeing you ?

## 2021-07-21 NOTE — ED Triage Notes (Signed)
Pt presents with right flank pain since Thursday. History of kidney stone. Reports pain got worse over the weekend. Denies urinary complaints.  ?

## 2021-07-25 DIAGNOSIS — N5201 Erectile dysfunction due to arterial insufficiency: Secondary | ICD-10-CM | POA: Diagnosis not present

## 2021-07-25 DIAGNOSIS — Z79899 Other long term (current) drug therapy: Secondary | ICD-10-CM | POA: Diagnosis not present

## 2021-07-25 DIAGNOSIS — N200 Calculus of kidney: Secondary | ICD-10-CM | POA: Diagnosis not present

## 2021-07-25 DIAGNOSIS — M5489 Other dorsalgia: Secondary | ICD-10-CM | POA: Diagnosis not present

## 2021-07-25 DIAGNOSIS — E291 Testicular hypofunction: Secondary | ICD-10-CM | POA: Diagnosis not present

## 2021-08-02 DIAGNOSIS — M5414 Radiculopathy, thoracic region: Secondary | ICD-10-CM | POA: Diagnosis not present

## 2021-08-02 DIAGNOSIS — M546 Pain in thoracic spine: Secondary | ICD-10-CM | POA: Diagnosis not present

## 2021-08-02 DIAGNOSIS — M5135 Other intervertebral disc degeneration, thoracolumbar region: Secondary | ICD-10-CM | POA: Diagnosis not present

## 2021-08-02 DIAGNOSIS — M549 Dorsalgia, unspecified: Secondary | ICD-10-CM | POA: Diagnosis not present

## 2021-08-22 DIAGNOSIS — F3340 Major depressive disorder, recurrent, in remission, unspecified: Secondary | ICD-10-CM | POA: Diagnosis not present

## 2021-08-22 DIAGNOSIS — F419 Anxiety disorder, unspecified: Secondary | ICD-10-CM | POA: Diagnosis not present

## 2021-08-23 DIAGNOSIS — M546 Pain in thoracic spine: Secondary | ICD-10-CM | POA: Diagnosis not present

## 2021-08-23 DIAGNOSIS — M5414 Radiculopathy, thoracic region: Secondary | ICD-10-CM | POA: Diagnosis not present

## 2021-08-23 DIAGNOSIS — M5135 Other intervertebral disc degeneration, thoracolumbar region: Secondary | ICD-10-CM | POA: Diagnosis not present

## 2021-09-13 ENCOUNTER — Ambulatory Visit: Payer: 59 | Attending: Orthopedic Surgery | Admitting: Physical Therapy

## 2021-09-13 ENCOUNTER — Encounter: Payer: Self-pay | Admitting: Physical Therapy

## 2021-09-13 DIAGNOSIS — M546 Pain in thoracic spine: Secondary | ICD-10-CM | POA: Insufficient documentation

## 2021-09-13 DIAGNOSIS — M5414 Radiculopathy, thoracic region: Secondary | ICD-10-CM | POA: Insufficient documentation

## 2021-09-13 DIAGNOSIS — M2560 Stiffness of unspecified joint, not elsewhere classified: Secondary | ICD-10-CM | POA: Insufficient documentation

## 2021-09-13 DIAGNOSIS — M6281 Muscle weakness (generalized): Secondary | ICD-10-CM | POA: Diagnosis not present

## 2021-09-13 NOTE — Therapy (Signed)
OUTPATIENT PHYSICAL THERAPY THORACOLUMBAR EVALUATION   Patient Name: Darrell Kelley MRN: [id number] DOB:[birth year], 56 y.o., male Today's Date: 09/14/2021   PT End of Session - 09/13/21 1506     Visit Number 1    Number of Visits 9    Date for PT Re-Evaluation 10/11/21    PT Start Time 1506    PT Stop Time 1601    PT Time Calculation (min) 55 min    Activity Tolerance Patient limited by pain    Behavior During Therapy [facility] for tasks assessed/performed             Past Medical History:  Diagnosis Date   History of kidney stones    Wears hearing aid in both ears    Past Surgical History:  Procedure Laterality Date   ADENOIDECTOMY     CHOLECYSTECTOMY     COLONOSCOPY WITH PROPOFOL N/A 07/28/2019   Procedure: COLONOSCOPY WITH BIOPSY;  Surgeon: Lucilla Lame, MD;  Location: [facility];  Service: Endoscopy;  Laterality: N/A;   TONSILLECTOMY     Patient Active Problem List   Diagnosis Date Noted   Special screening for malignant neoplasms, colon     PCP: Sofie Hartigan, MD  REFERRING PROVIDER: Reche Dixon, PA-C  REFERRING DIAG: Thoracic spine pain  Rationale for Evaluation and Treatment Rehabilitation  THERAPY DIAG:  Pain in thoracic spine  Radiculopathy of thoracic region  Muscle weakness (generalized)  Joint stiffness  ONSET DATE: 4 mo ago  SUBJECTIVE:                                                                                                                                                                                           SUBJECTIVE STATEMENT: Pt came into clinic with tightness, burning, and aching in L thoracic paraspinals, but no pain in cervical or lumbar region. Pt prefers to stand at work and during ADLs, and stands often throughout the day to relieve pain and discomfort. Pt experiences most muscular tightness when seated. Pt sleeps on L side of his body with knees bent, and with a pillow at his head and a pillow under his R  arm.  Pt has been sleeping the same way since childhood, and does not move much at night. Pt is unable to lay for prolonged periods of time on his back due to pain. Pt rides his bike to work daily, which has an IT trainer setting that aids in transport when his back is bothering him. Pt enjoys hiking, and is unable to do that currently due to pain. Pt also has trouble bending forward to do dishes, and feels like he "gets  stuck" in a forward flexed position at times.   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   PAIN:  Are you having pain? Yes: At worst, NPRS scale: 6-7/10 (after skipping down the hallway) Current: NPRS scale: 3/10 Pain location: L thoracic paraspinals Pain description: aching and burning  Aggravating factors: sitting for prolonged periods.  Relieving factors: Meloxicam, other pain killer,  (starts with a T), and tylenol and standing up.    PRECAUTIONS: None  WEIGHT BEARING RESTRICTIONS No  FALLS:  Has patient fallen in last 6 months? 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    OBJECTIVE:   DIAGNOSTIC FINDINGS:  CLINICAL DATA:  Right flank pain, history of kidney stones   EXAM: ABDOMEN - 1 VIEW   COMPARISON:  Prior abdominal radiograph 10/03/2011; prior CT scan of the abdomen and pelvis 01/08/2020   FINDINGS: The bowel gas pattern is normal. No radio-opaque calculi or other significant radiographic abnormality are seen. Small vascular phleboliths project over the right aspect  of the anatomic pelvis. The largest demonstrates no interval change dating back to June of 2013.   IMPRESSION: Negative.    PATIENT SURVEYS:  FOTO 73 ; 60 predicted goal  SCREENING FOR RED FLAGS: Bowel or bladder incontinence: No Spinal tumors: No Cauda equina syndrome: No Compression fracture: No Abdominal aneurysm: No  COGNITION:  Overall cognitive status: Within functional limits for tasks assessed     SENSATION: Hx of numbness in UE in ulnar nerve distribution.   POSTURE: No Significant postural limitations  PALPATION: Tenderness to palpation of L paraspinals and pain during T5-T10 mobilizations   LUMBAR ROM:   Active  A/PROM  eval  Flexion WFL  Extension WFL  Right lateral flexion Limited by pain  Left lateral flexion Limited by pain  Right rotation WFL  Left rotation WFL   (Blank rows = not tested)  LUMBAR SPECIAL TESTS:  Straight leg raise test: Negative   GAIT: Distance walked: walked through clinic Assistive device utilized: None Level of assistance: Complete Independence Comments: Slow gait with wide stance    TODAY'S TREATMENT  Evaluation/ HEP issued   PATIENT EDUCATION:  Education details: Access Code: [place] Person educated: Patient Education method: Explanation, Demonstration, Tactile cues, and Verbal cues Education comprehension: verbalized understanding, returned demonstration, and tactile cues required   HOME EXERCISE PROGRAM: Access Code: [email or web address] URL: [email or web address] Date: 09/13/2021 Prepared by: Dorcas Carrow  Exercises - Supine Lower Trunk Rotation  - 1 x daily - 7 x weekly - 3 sets - 10 reps - 15 second hold - Seated Thoracic Extension Arms Overhead  - 1 x daily - 7 x weekly - 3 sets - 10 reps - 5 second hold - Thoracic Extension Mobilization on Foam Roll  - 1 x daily - 7 x weekly - 2 sets - 10 reps  ASSESSMENT:  CLINICAL IMPRESSION: Patient is a 56 y.o. male who was seen for physical therapy  evaluation and treatment for pain in his L thoracic region. Pt did not have trauma to the  region, and does not stand with any excess kyphosis in his thoracic region. Pt has pain with forward flexion and pain when seated, and is unable to perform much physical activity at this time due to pain levels. Pt experienced pain in his lower abdomen upon mobilization of thoracic vertebrae.    OBJECTIVE IMPAIRMENTS decreased activity tolerance, decreased endurance, decreased mobility, difficulty walking, hypomobility, and pain.   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   REHAB POTENTIAL: Good  CLINICAL DECISION MAKING: Evolving/moderate complexity  EVALUATION COMPLEXITY: Moderate   GOALS: Goals reviewed with patient? Yes  SHORT TERM GOALS: Target date: 09/27/21 Patient will be able to sit in a chair during work for >1 hour without discomfort in order to complete documentation efficiently at work.  Baseline: Goal status: INITIAL  LONG TERM GOALS: Target date: 10/11/21  Pt will increase FOTO score from 45 to 55 in order to accomplish ADLs more efficiently with decreased pain in thoracic spine.  Baseline: 45 Goal status: INITIAL  2.  Patient will demonstrate compliance with HEP, and perform all tx interventions independently at home without increased pain or discomfort.  Baseline: Pt rides bike at home, but no set exercise interventions thus far  Goal status: INITIAL  3.  Pt will decrease pain from a 7/10 to less than a 3/10 with all work activities and ADLs.  Baseline: 7-8/10 pain in L thoracic Goal status: INITIAL    PLAN: PT FREQUENCY: 2x/week  PT DURATION: 4 weeks  PLANNED INTERVENTIONS: Therapeutic exercises, Therapeutic activity,  Neuromuscular re-education, Balance training, Patient/Family education, Joint manipulation, Joint mobilization, Stair training, Moist heat, Traction, Biofeedback, and Manual therapy.  PLAN FOR NEXT SESSION: Plan to perform thoracic joint mob and manipulation, and further HEP intervention for increased mobility in thoracic spine.   Pura Spice, PT, DPT # [id number] Andee Lineman, SPT 09/14/2021, 12:25 PM

## 2021-09-15 ENCOUNTER — Encounter: Payer: Self-pay | Admitting: Physical Therapy

## 2021-09-15 ENCOUNTER — Ambulatory Visit: Payer: 59 | Admitting: Physical Therapy

## 2021-09-15 DIAGNOSIS — M2560 Stiffness of unspecified joint, not elsewhere classified: Secondary | ICD-10-CM | POA: Diagnosis not present

## 2021-09-15 DIAGNOSIS — M546 Pain in thoracic spine: Secondary | ICD-10-CM

## 2021-09-15 DIAGNOSIS — M6281 Muscle weakness (generalized): Secondary | ICD-10-CM | POA: Diagnosis not present

## 2021-09-15 DIAGNOSIS — M5414 Radiculopathy, thoracic region: Secondary | ICD-10-CM

## 2021-09-15 NOTE — Therapy (Addendum)
OUTPATIENT PHYSICAL THERAPY THORACOLUMBAR TREATMENT   Patient Name: Darrell Kelley MRN: [id number] DOB:[birth year], 56 y.o., male Today's Date: 09/15/2021   PT End of Session - 09/15/21 1508     Visit Number 2    Number of Visits 9    Date for PT Re-Evaluation 10/11/21    PT Start Time 1508    PT Stop Time 1555    PT Time Calculation (min) 47 min    Activity Tolerance Patient limited by pain    Behavior During Therapy [facility] for tasks assessed/performed             Past Medical History:  Diagnosis Date   History of kidney stones    Wears hearing aid in both ears    Past Surgical History:  Procedure Laterality Date   ADENOIDECTOMY     CHOLECYSTECTOMY     COLONOSCOPY WITH PROPOFOL N/A 07/28/2019   Procedure: COLONOSCOPY WITH BIOPSY;  Surgeon: Lucilla Lame, MD;  Location: [place];  Service: Endoscopy;  Laterality: N/A;   TONSILLECTOMY     Patient Active Problem List   Diagnosis Date Noted   Special screening for malignant neoplasms, colon     PCP: Sofie Hartigan, MD  REFERRING PROVIDER: Reche Dixon, PA-C  REFERRING DIAG: Thoracic spine pain  Rationale for Evaluation and Treatment Rehabilitation  THERAPY DIAG:  Joint stiffness  Muscle weakness (generalized)  Radiculopathy of thoracic region  Pain in thoracic spine  ONSET DATE: 4 mo ago  SUBJECTIVE:                                                                                                                                                                                           SUBJECTIVE STATEMENT:  Pt has been pretty active moving around large machinery, and has attempted the HEP since last time he was in the clinic. Pt appreciates the HEP, and enjoys extension over the chair most. Pt has 5/10 pain today, after doing manual labor on the floor at work. Pt utilizes a heating pad around his neck, but has not used it on T-spine yet. Pt has been taking meloxicam daily to alleviate  discomfort.   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   PAIN:  Are you having pain? Yes: At worst, NPRS scale: 6-7/10 (after skipping down the hallway) Current: NPRS scale: 3/10 Pain location: L thoracic paraspinals Pain description: aching and burning  Aggravating factors: sitting for prolonged periods.  Relieving factors: Meloxicam, other pain killer,  (starts with a T), and tylenol and standing up.    PRECAUTIONS: None  WEIGHT BEARING RESTRICTIONS No  FALLS:  Has patient fallen in last 6 months? 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    OBJECTIVE:   DIAGNOSTIC FINDINGS:  CLINICAL DATA:  Right flank pain, history of kidney stones   EXAM: ABDOMEN - 1 VIEW   COMPARISON:  Prior abdominal radiograph 10/03/2011; prior CT scan of the abdomen and pelvis 01/08/2020   FINDINGS: The bowel gas pattern is normal. No radio-opaque calculi or other significant radiographic abnormality are seen. Small vascular phleboliths project over the right aspect of the anatomic pelvis. The largest demonstrates no interval change dating back to June of 2013.   IMPRESSION: Negative.    PATIENT SURVEYS:  FOTO 65 ; 60 predicted goal  SCREENING FOR RED FLAGS: Bowel or bladder incontinence: No Spinal tumors: No Cauda equina syndrome: No Compression fracture: No Abdominal aneurysm: No  COGNITION:  Overall cognitive status: Within functional limits for tasks assessed     SENSATION: Hx of numbness  in UE in ulnar nerve distribution.   POSTURE: No Significant postural limitations  PALPATION: Tenderness to palpation of L paraspinals and pain during T5-T10 mobilizations   LUMBAR ROM:   Active  A/PROM  eval  Flexion WFL  Extension WFL  Right lateral flexion Limited by pain  Left lateral flexion Limited by pain  Right rotation WFL  Left rotation WFL   (Blank rows = not tested)  LUMBAR SPECIAL TESTS:  Straight leg raise test: Negative   GAIT: Distance walked: walked through clinic Assistive device utilized: None Level of assistance: Complete Independence Comments: Slow gait with wide stance    TODAY'S TREATMENT   09/15/21:  MH applied to mid-low back prior to PT tx. Session  Manual tx.:  Prone  L hand over head - reproduction of symtpoms in stretch  Hypervolt to thoracic and lumbar paraspinals   Grade III and Grade IV mobs to thoracic and lumbar vertebrae  Active trunk twist with overpressure at end range B x5   There activities: Standing extension with arms above head, extending to touch fingerips to wall x 10  Standing with back against wall, creating a W -> Y arm motion with arms with B UE  There ex:  Open books B - overpressure at end range x 8   PATIENT EDUCATION:  Education details: Access Code: [place] Person educated: Patient Education method: Explanation, Demonstration, Tactile cues, and Verbal cues Education comprehension: verbalized understanding, returned demonstration, and tactile cues required   HOME EXERCISE PROGRAM: Access Code: [email or web address] URL: [email or web address] Date: 09/13/2021 Prepared by: Dorcas Carrow  Exercises - Supine Lower Trunk Rotation  - 1 x daily - 7 x weekly - 3 sets - 10 reps - 15 second hold - Seated Thoracic Extension Arms Overhead  - 1 x daily - 7 x weekly - 3 sets - 10 reps - 5 second hold - Thoracic Extension Mobilization on Foam Roll  - 1 x daily - 7 x weekly - 2 sets - 10  reps  ASSESSMENT:  CLINICAL IMPRESSION: Pt has pain with forward flexion and pain when seated for prolonged periods  of time, and experiences pain in his lower abdomen upon mobilization of thoracic vertebrae. Patient responded well to Grade IV thoracic mobs, and stated that they caused "pressure" but increased mobility in t-spine. Pt enjoyed spinal extension over blue ball more than extending over partial foam roller, and appreciated the stretch that accompanied the intervention. Pt also utilized a stretch to relieve symptomology, where he stood in a doorway, and put his left arm above head and stepped away from doorway to laterally bend to the right. Pt responded well to it, and will continue to do this at home. Pt will benefit from skilled PT to increase his thoracic mobility and decrease pain in his thoracic paraspinals throughout ADLs.   OBJECTIVE IMPAIRMENTS decreased activity tolerance, decreased endurance, decreased mobility, difficulty walking, hypomobility, and pain.   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   REHAB POTENTIAL: Good  CLINICAL DECISION MAKING: Evolving/moderate complexity  EVALUATION COMPLEXITY: Moderate   GOALS: Goals reviewed with patient? Yes  SHORT TERM GOALS: Target date: 09/27/21 Patient will be able to sit in a chair during work for >1 hour without discomfort in order to complete documentation efficiently at work.  Baseline: Goal status: INITIAL  LONG TERM GOALS: Target date: 10/11/21  Pt will increase FOTO score from 45 to 55 in order to accomplish ADLs more efficiently with decreased pain in thoracic spine.  Baseline: 45 Goal status: INITIAL  2.  Patient will demonstrate compliance with HEP, and perform all  tx interventions independently at home without increased pain or discomfort.  Baseline: Pt rides bike at home, but no set exercise interventions thus far  Goal status: INITIAL  3.  Pt will decrease pain from a 7/10 to less than a 3/10 with all work activities and ADLs.  Baseline: 7-8/10 pain in L thoracic Goal status: INITIAL    PLAN: PT FREQUENCY: 2x/week  PT DURATION: 4 weeks  PLANNED INTERVENTIONS: Therapeutic exercises, Therapeutic activity, Neuromuscular re-education, Balance training, Patient/Family education, Joint manipulation, Joint mobilization, Stair training, Moist heat, Traction, Biofeedback, and Manual therapy.  PLAN FOR NEXT SESSION: Plan to perform thoracic joint mob and manipulation, and further HEP intervention for increased mobility in thoracic spine.   Pura Spice, PT, DPT # [id number] Andee Lineman, SPT 09/15/2021, 4:20 PM

## 2021-09-20 ENCOUNTER — Ambulatory Visit: Payer: 59 | Admitting: Physical Therapy

## 2021-09-20 ENCOUNTER — Encounter: Payer: Self-pay | Admitting: Physical Therapy

## 2021-09-20 DIAGNOSIS — M6281 Muscle weakness (generalized): Secondary | ICD-10-CM

## 2021-09-20 DIAGNOSIS — M2560 Stiffness of unspecified joint, not elsewhere classified: Secondary | ICD-10-CM

## 2021-09-20 DIAGNOSIS — M546 Pain in thoracic spine: Secondary | ICD-10-CM

## 2021-09-20 DIAGNOSIS — M5414 Radiculopathy, thoracic region: Secondary | ICD-10-CM | POA: Diagnosis not present

## 2021-09-20 NOTE — Therapy (Incomplete)
OUTPATIENT PHYSICAL THERAPY THORACOLUMBAR TREATMENT   Patient Name: Darrell Kelley MRN: [id number] DOB:1965-06-22, 56 y.o., male Today's Date: 09/20/2021   PT End of Session - 09/20/21 1905     Visit Number 3    Number of Visits 9    Date for PT Re-Evaluation 10/11/21    PT Start Time 4193    PT Stop Time 1601    PT Time Calculation (min) 50 min    Activity Tolerance Patient limited by pain    Behavior During Therapy [facility] for tasks assessed/performed             Past Medical History:  Diagnosis Date   History of kidney stones    Wears hearing aid in both ears    Past Surgical History:  Procedure Laterality Date   ADENOIDECTOMY     CHOLECYSTECTOMY     COLONOSCOPY WITH PROPOFOL N/A 07/28/2019   Procedure: COLONOSCOPY WITH BIOPSY;  Surgeon: Lucilla Lame, MD;  Location: [place];  Service: Endoscopy;  Laterality: N/A;   TONSILLECTOMY     Patient Active Problem List   Diagnosis Date Noted   Special screening for malignant neoplasms, colon     PCP: Sofie Hartigan, MD  REFERRING PROVIDER: Reche Dixon, PA-C  REFERRING DIAG: Thoracic spine pain  Rationale for Evaluation and Treatment Rehabilitation  THERAPY DIAG:  Joint stiffness  Muscle weakness (generalized)  Radiculopathy of thoracic region  Pain in thoracic spine  ONSET DATE: 4 mo ago  SUBJECTIVE:                                                                                                                                                                                           SUBJECTIVE STATEMENT:  Pt had a busy weekend of camping, and stated that his body felt very tired today. Pt had 3/10 pain at the beginning of tx, with increased tightness in his paraspinals. Pt's abdominal radicular symptomology has continued, and occurs frequently throughout the day. Pt continues to ride his electric bike to and from work, which is not producing any discomfort. Pt has been taking meloxicam daily  to alleviate discomfort.   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   PAIN:  Are you having pain? Yes: At worst, NPRS scale: 6-7/10 (after skipping down the hallway) Current: NPRS scale: 3/10 Pain location: L thoracic paraspinals Pain description: aching and burning  Aggravating factors:  sitting for prolonged periods.  Relieving factors: Meloxicam, other pain killer,  (starts with a T), and tylenol and standing up.    PRECAUTIONS: None  WEIGHT BEARING RESTRICTIONS No  FALLS:  Has patient fallen in last 6 months? 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    OBJECTIVE:   DIAGNOSTIC FINDINGS:  CLINICAL DATA:  Right flank pain, history of kidney stones   EXAM: ABDOMEN - 1 VIEW   COMPARISON:  Prior abdominal radiograph 10/03/2011; prior CT scan of the abdomen and pelvis 01/08/2020   FINDINGS: The bowel gas pattern is normal. No radio-opaque calculi or other significant radiographic abnormality are seen. Small vascular phleboliths project over the right aspect of the anatomic pelvis. The largest demonstrates no interval change dating back to June of 2013.   IMPRESSION: Negative.    PATIENT SURVEYS:  FOTO 23 ; 60 predicted goal  SCREENING FOR RED FLAGS: Bowel or bladder incontinence: No Spinal tumors: No Cauda equina syndrome: No Compression fracture: No Abdominal aneurysm: No  COGNITION:  Overall cognitive status: Within functional limits for tasks  assessed     SENSATION: Hx of numbness in UE in ulnar nerve distribution.   POSTURE: No Significant postural limitations  PALPATION: Tenderness to palpation of L paraspinals and pain during T5-T10 mobilizations   LUMBAR ROM:   Active  A/PROM  eval  Flexion WFL  Extension WFL  Right lateral flexion Limited by pain  Left lateral flexion Limited by pain  Right rotation WFL  Left rotation WFL   (Blank rows = not tested)  LUMBAR SPECIAL TESTS:  Straight leg raise test: Negative   GAIT: Distance walked: walked through clinic Assistive device utilized: None Level of assistance: Complete Independence Comments: Slow gait with wide stance    TODAY'S TREATMENT   09/20/21:    09/15/21:  MH applied to mid-low back prior to PT tx. Session  Manual tx.:  Prone  L hand over head - reproduction of symtpoms in stretch  Hypervolt to thoracic and lumbar paraspinals   Grade III and Grade IV mobs to thoracic and lumbar vertebrae  Active trunk twist with overpressure at end range B x5   There activities: Standing extension with arms above head, extending to touch fingerips to wall x 10  Standing with back against wall, creating a W -> Y arm motion with arms with B UE  There ex:  Open books B - overpressure at end range x 8   PATIENT EDUCATION:  Education details: Access Code: [place] Person educated: Patient Education method: Explanation, Demonstration, Tactile cues, and Verbal cues Education comprehension: verbalized understanding, returned demonstration, and tactile cues required   HOME EXERCISE PROGRAM: Access Code: [email or web address] URL: [email or web address] Date: 09/13/2021 Prepared by: Dorcas Carrow  Exercises - Supine Lower Trunk Rotation  - 1 x daily - 7 x weekly - 3 sets - 10 reps - 15 second hold - Seated Thoracic Extension Arms Overhead  - 1 x daily - 7 x weekly - 3 sets - 10 reps - 5 second hold - Thoracic Extension Mobilization on Foam Roll  -  1 x daily - 7 x weekly - 2 sets - 10 reps  ASSESSMENT:  CLINICAL IMPRESSION: Pt has pain with forward flexion and pain when seated for  prolonged periods of time, and experiences pain in his lower abdomen upon mobilization of thoracic vertebrae. Patient responded well to Grade IV thoracic mobs, and stated that they caused "pressure" but increased mobility in t-spine. Pt enjoyed spinal extension over blue ball more than extending over partial foam roller, and appreciated the stretch that accompanied the intervention. Pt also utilized a stretch to relieve symptomology, where he stood in a doorway, and put his left arm above head and stepped away from doorway to laterally bend to the right. Pt responded well to it, and will continue to do this at home. Pt will benefit from skilled PT to increase his thoracic mobility and decrease pain in his thoracic paraspinals throughout ADLs.   OBJECTIVE IMPAIRMENTS decreased activity tolerance, decreased endurance, decreased mobility, difficulty walking, hypomobility, and pain.   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   REHAB POTENTIAL: Good  CLINICAL DECISION MAKING: Evolving/moderate complexity  EVALUATION COMPLEXITY: Moderate   GOALS: Goals reviewed with patient? Yes  SHORT TERM GOALS: Target date: 09/27/21 Patient will be able to sit in a chair during work for >1 hour without discomfort in order to complete documentation efficiently at work.  Baseline: Goal status: INITIAL  LONG TERM GOALS: Target date: 10/11/21  Pt will increase FOTO score from 45 to 55 in order to accomplish ADLs more efficiently with decreased pain in thoracic spine.  Baseline: 45 Goal status: INITIAL  2.  Patient will demonstrate  compliance with HEP, and perform all tx interventions independently at home without increased pain or discomfort.  Baseline: Pt rides bike at home, but no set exercise interventions thus far  Goal status: INITIAL  3.  Pt will decrease pain from a 7/10 to less than a 3/10 with all work activities and ADLs.  Baseline: 7-8/10 pain in L thoracic Goal status: INITIAL    PLAN: PT FREQUENCY: 2x/week  PT DURATION: 4 weeks  PLANNED INTERVENTIONS: Therapeutic exercises, Therapeutic activity, Neuromuscular re-education, Balance training, Patient/Family education, Joint manipulation, Joint mobilization, Stair training, Moist heat, Traction, Biofeedback, and Manual therapy.  PLAN FOR NEXT SESSION: Plan to perform thoracic joint mob and manipulation, and further HEP intervention for increased mobility in thoracic spine.   Pura Spice, PT, DPT # [id number] Andee Lineman, SPT 09/20/2021, 7:09 PM

## 2021-09-21 ENCOUNTER — Other Ambulatory Visit: Payer: Self-pay

## 2021-09-22 ENCOUNTER — Ambulatory Visit: Payer: 59 | Admitting: Physical Therapy

## 2021-09-22 DIAGNOSIS — M2560 Stiffness of unspecified joint, not elsewhere classified: Secondary | ICD-10-CM

## 2021-09-22 DIAGNOSIS — M546 Pain in thoracic spine: Secondary | ICD-10-CM

## 2021-09-22 DIAGNOSIS — M6281 Muscle weakness (generalized): Secondary | ICD-10-CM | POA: Diagnosis not present

## 2021-09-22 DIAGNOSIS — M5414 Radiculopathy, thoracic region: Secondary | ICD-10-CM

## 2021-09-23 NOTE — Therapy (Addendum)
OUTPATIENT PHYSICAL THERAPY THORACOLUMBAR TREATMENT   Patient Name: Darrell Kelley MRN: [id number] DOB:11/30/1965, 56 y.o., male Today's Date: 09/23/2021   PT End of Session - 09/22/21 1351     Visit Number 4    Number of Visits 9    Date for PT Re-Evaluation 10/11/21    PT Start Time 1520    PT Stop Time 1601    PT Time Calculation (min) 41 min    Activity Tolerance Patient limited by pain    Behavior During Therapy [facility] for tasks assessed/performed             Past Medical History:  Diagnosis Date   History of kidney stones    Wears hearing aid in both ears    Past Surgical History:  Procedure Laterality Date   ADENOIDECTOMY     CHOLECYSTECTOMY     COLONOSCOPY WITH PROPOFOL N/A 07/28/2019   Procedure: COLONOSCOPY WITH BIOPSY;  Surgeon: Lucilla Lame, MD;  Location: [place];  Service: Endoscopy;  Laterality: N/A;   TONSILLECTOMY     Patient Active Problem List   Diagnosis Date Noted   Special screening for malignant neoplasms, colon     PCP: Sofie Hartigan, MD  REFERRING PROVIDER: Reche Dixon, PA-C  REFERRING DIAG: Thoracic spine pain  Rationale for Evaluation and Treatment Rehabilitation  THERAPY DIAG:  Joint stiffness  Muscle weakness (generalized)  Radiculopathy of thoracic region  Pain in thoracic spine  ONSET DATE: 4 mo ago  SUBJECTIVE:                                                                                                                                                                                           SUBJECTIVE STATEMENT:  Pt stated that his body felt very tired today, and had markedly increased pain and discomfort. Pt had 6/10 pain at the beginning of tx, with increased tightness in his paraspinals. Pt's abdominal radicular symptomology has continued, and occurs frequently throughout the day. Pt left work to take a muscle relaxer, and said his pain was at an "all-time-high". Pt continues to ride his electric  bike to and from work, which is not producing any discomfort. Pt has been taking meloxicam daily to alleviate discomfort as well, and stated that he had 8/10 pain at end of tx session.  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   PAIN:  Are you having  pain? Yes: At worst, NPRS scale: 6-7/10 (after skipping down the hallway) Current: NPRS scale: 6-8/10 Pain location: L thoracic paraspinals Pain description: aching and burning  Aggravating factors: sitting for prolonged periods.  Relieving factors: Meloxicam, other pain killer,  (starts with a T), and tylenol and standing up.    PRECAUTIONS: None  WEIGHT BEARING RESTRICTIONS No  FALLS:  Has patient fallen in last 6 months? 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    OBJECTIVE:   DIAGNOSTIC FINDINGS:  CLINICAL DATA:  Right flank pain, history of kidney stones   EXAM: ABDOMEN - 1 VIEW   COMPARISON:  Prior abdominal radiograph 10/03/2011; prior CT scan of the abdomen and pelvis 01/08/2020   FINDINGS: The bowel gas pattern is normal. No radio-opaque calculi or other significant radiographic abnormality are seen. Small vascular phleboliths project over the right aspect of the anatomic pelvis. The largest demonstrates no interval change dating back to June of 2013.   IMPRESSION: Negative.    PATIENT SURVEYS:  FOTO 47 ; 60 predicted goal  SCREENING FOR RED FLAGS: Bowel or bladder incontinence: No Spinal tumors: No Cauda equina  syndrome: No Compression fracture: No Abdominal aneurysm: No  COGNITION:  Overall cognitive status: Within functional limits for tasks assessed     SENSATION: Hx of numbness in UE in ulnar nerve distribution.   POSTURE: No Significant postural limitations  PALPATION: Tenderness to palpation of L paraspinals and pain during T5-T10 mobilizations   LUMBAR ROM:   Active  A/PROM  eval  Flexion WFL  Extension WFL  Right lateral flexion Limited by pain  Left lateral flexion Limited by pain  Right rotation WFL  Left rotation WFL   (Blank rows = not tested)  LUMBAR SPECIAL TESTS:  Straight leg raise test: Negative   GAIT: Distance walked: walked through clinic Assistive device utilized: None Level of assistance: Complete Independence Comments: Slow gait with wide stance    TODAY'S TREATMENT   09/22/21:  Manual therapy: Hypervolt to paraspinals - 10 mins level 2 Grade II and Grade III A/P mobs to thoracic and lumbar vertebrae - 15 s per vertebrae Grade II and Grade III unilateral mobs to thoracic and lumbar vertebrae - 15 s per vertebrae in both directions  STM to paraspinals - 5 mins   There ex: Prone: Shoulder flexion, scaption, and horizontal abduction x 10, with 5s hold - pt limited by pain in shoulder flexion  E-stim 10 mins of estim to thoracic spine in prone position- EMPI continuum unit with IFC (4 large electrodes).  TENS, large muscle, back set up with pulse rate 100 pps, pulse duration pre-set,  intensity 12 mA in seated position.  Pt. Attended during modality to adjust tx. For pain mgmt/ sensory setting.   Pt education on PCP follow up regarding increased pain and symptomology.   09/20/21:   Manual Ther:   Prone: Hypervolt to paraspinals - 8 mins level 2 Grade III and Grade IV A/P mobs to thoracic and lumbar vertebrae - 15 s per vertebrae Grade III and Grade IV unilateral mobs to thoracic and lumbar vertebrae - 15 s per vertebrae in both directions    Thereex: Standing extension with arms above head, extending back to touch fingerips to wall x 10  Standing doorway stretch with hand over head and lateral bend to stretch out lats, and thoracic intercostals Extension over blue ball - 3x 30 s hold  Extension over red wedge on blue table in seated position - this induced discomfort and thoracic spine cavitation - 15 s holds x 3  Prone heating pad to thoracic spine for 5 mins 15 mins of estim to thoracic spine in prone position- EMPI continuum unit with IFC (4 large electrodes) underneath heating pad.  TENS, large muscle, back set up with pulse rate 100 pps, pulse duration pre-set,  intensity 12 mA.  Pt. Attended during modality to adjust tx. For pain mgmt/ sensory setting.    09/15/21:  MH applied to mid-low back prior to PT tx. Session  Manual tx.:  Prone  L hand over head - reproduction of symtpoms in stretch  Hypervolt to thoracic and lumbar paraspinals   Grade III and Grade IV mobs to thoracic and lumbar vertebrae  Active trunk twist with overpressure at end range B x5   There activities: Standing extension with arms above head, extending to touch fingerips to wall x 10  Standing with back against wall, creating a W -> Y arm motion with arms with B UE  There ex:  Open books B - overpressure at end range x 8   PATIENT EDUCATION:  Education details: Access Code: [place] Person educated: Patient Education method: Explanation, Demonstration, Tactile cues, and Verbal cues Education comprehension: verbalized understanding, returned demonstration, and tactile cues required   HOME EXERCISE PROGRAM: Access Code: [email or web address] URL: [email or web address] Date: 09/13/2021 Prepared by: Dorcas Carrow  Exercises - Supine Lower Trunk Rotation  - 1 x daily - 7 x weekly - 3 sets - 10 reps - 15 second hold - Seated Thoracic Extension Arms Overhead  - 1 x daily - 7 x weekly - 3 sets - 10 reps - 5 second hold - Thoracic  Extension Mobilization on Foam Roll  - 1 x daily - 7 x weekly - 2 sets - 10 reps  ASSESSMENT:  CLINICAL IMPRESSION: Pt has pain with forward flexion, pain when seated for prolonged periods of time, and experiences pain in his lower abdomen upon mobilization of thoracic vertebrae. Pt did not respond well to grade II/III thoracic mobs, and stated that his pain was increased throughout tx. Pt utilized a stretch to relieve symptomology, where he stood in a doorway, and put his left arm above head and stepped away from doorway to laterally bend to the right. Pt responded well to it, and will continue to do this at home. Pt did not respond well to flexing UE over head in prone position, and his pain was increased drastically. Pt felt that estim did not relieve discomfort today, and discontinued tx session due to pain in t-spine radiating into abdominal viscera. SPT suggested pt follow up with PCP.   OBJECTIVE IMPAIRMENTS decreased activity tolerance, decreased endurance, decreased mobility, difficulty walking, hypomobility, and pain.   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   REHAB POTENTIAL: Good  CLINICAL DECISION MAKING: Evolving/moderate complexity  EVALUATION COMPLEXITY: Moderate   GOALS: Goals reviewed with patient? Yes  SHORT TERM GOALS: Target date: 09/27/21 Patient will be able to sit in a chair during work for >1 hour without discomfort in order to complete documentation efficiently  at work.  Baseline: Goal status: INITIAL  LONG TERM GOALS: Target date: 10/11/21  Pt will increase FOTO score from 45 to 55 in order to accomplish ADLs more efficiently with decreased pain in thoracic spine.  Baseline: 45 Goal status: INITIAL  2.  Patient  will demonstrate compliance with HEP, and perform all tx interventions independently at home without increased pain or discomfort.  Baseline: Pt rides bike at home, but no set exercise interventions thus far  Goal status: INITIAL  3.  Pt will decrease pain from a 7/10 to less than a 3/10 with all work activities and ADLs.  Baseline: 7-8/10 pain in L thoracic Goal status: INITIAL    PLAN: PT FREQUENCY: 2x/week  PT DURATION: 4 weeks  PLANNED INTERVENTIONS: Therapeutic exercises, Therapeutic activity, Neuromuscular re-education, Balance training, Patient/Family education, Joint manipulation, Joint mobilization, Stair training, Moist heat, Traction, Biofeedback, and Manual therapy.  PLAN FOR NEXT SESSION: Suggested PCP followup  Pura Spice, PT, DPT # [id number] Andee Lineman, SPT 09/23/2021, 1:53 PM

## 2021-09-27 ENCOUNTER — Encounter: Payer: 59 | Admitting: Physical Therapy

## 2021-09-27 ENCOUNTER — Other Ambulatory Visit (HOSPITAL_BASED_OUTPATIENT_CLINIC_OR_DEPARTMENT_OTHER): Payer: Self-pay

## 2021-09-27 DIAGNOSIS — M546 Pain in thoracic spine: Secondary | ICD-10-CM | POA: Diagnosis not present

## 2021-09-27 DIAGNOSIS — M5414 Radiculopathy, thoracic region: Secondary | ICD-10-CM | POA: Diagnosis not present

## 2021-09-27 DIAGNOSIS — M5135 Other intervertebral disc degeneration, thoracolumbar region: Secondary | ICD-10-CM | POA: Diagnosis not present

## 2021-09-27 MED ORDER — MELOXICAM 15 MG PO TABS
ORAL_TABLET | ORAL | 1 refills | Status: DC
  Filled 2021-09-27: qty 30, 30d supply, fill #0

## 2021-09-28 ENCOUNTER — Other Ambulatory Visit: Payer: Self-pay | Admitting: Orthopedic Surgery

## 2021-09-28 DIAGNOSIS — M5414 Radiculopathy, thoracic region: Secondary | ICD-10-CM

## 2021-09-28 DIAGNOSIS — M546 Pain in thoracic spine: Secondary | ICD-10-CM

## 2021-09-28 DIAGNOSIS — M5135 Other intervertebral disc degeneration, thoracolumbar region: Secondary | ICD-10-CM

## 2021-09-29 ENCOUNTER — Encounter: Payer: 59 | Admitting: Physical Therapy

## 2021-10-03 ENCOUNTER — Ambulatory Visit
Admission: RE | Admit: 2021-10-03 | Discharge: 2021-10-03 | Disposition: A | Discharge status: Home / Self Care | Payer: 59 | Source: Ambulatory Visit | Attending: Orthopedic Surgery | Admitting: Orthopedic Surgery

## 2021-10-03 DIAGNOSIS — M546 Pain in thoracic spine: Secondary | ICD-10-CM | POA: Diagnosis not present

## 2021-10-03 DIAGNOSIS — M5135 Other intervertebral disc degeneration, thoracolumbar region: Secondary | ICD-10-CM | POA: Insufficient documentation

## 2021-10-03 DIAGNOSIS — M5414 Radiculopathy, thoracic region: Secondary | ICD-10-CM | POA: Diagnosis not present

## 2021-10-03 DIAGNOSIS — M40204 Unspecified kyphosis, thoracic region: Secondary | ICD-10-CM | POA: Diagnosis not present

## 2021-10-04 ENCOUNTER — Encounter: Payer: 59 | Admitting: Physical Therapy

## 2021-10-06 ENCOUNTER — Encounter: Payer: 59 | Admitting: Physical Therapy

## 2021-10-07 ENCOUNTER — Other Ambulatory Visit: Payer: Self-pay

## 2021-10-10 ENCOUNTER — Other Ambulatory Visit (HOSPITAL_BASED_OUTPATIENT_CLINIC_OR_DEPARTMENT_OTHER): Payer: Self-pay

## 2021-10-13 ENCOUNTER — Encounter: Payer: 59 | Admitting: Physical Therapy

## 2021-10-13 ENCOUNTER — Other Ambulatory Visit: Payer: Self-pay | Admitting: Orthopedic Surgery

## 2021-10-13 DIAGNOSIS — M546 Pain in thoracic spine: Secondary | ICD-10-CM

## 2021-10-13 DIAGNOSIS — M549 Dorsalgia, unspecified: Secondary | ICD-10-CM

## 2021-10-13 DIAGNOSIS — M5414 Radiculopathy, thoracic region: Secondary | ICD-10-CM

## 2021-10-18 ENCOUNTER — Encounter: Payer: 59 | Admitting: Physical Therapy

## 2021-10-20 ENCOUNTER — Encounter: Payer: 59 | Admitting: Physical Therapy

## 2021-10-28 ENCOUNTER — Ambulatory Visit
Admission: RE | Admit: 2021-10-28 | Discharge: 2021-10-28 | Disposition: A | Discharge status: Home / Self Care | Payer: 59 | Source: Ambulatory Visit | Attending: Orthopedic Surgery | Admitting: Orthopedic Surgery

## 2021-10-28 DIAGNOSIS — M549 Dorsalgia, unspecified: Secondary | ICD-10-CM | POA: Diagnosis not present

## 2021-10-28 DIAGNOSIS — M5414 Radiculopathy, thoracic region: Secondary | ICD-10-CM | POA: Diagnosis not present

## 2021-10-28 DIAGNOSIS — M546 Pain in thoracic spine: Secondary | ICD-10-CM

## 2021-10-28 DIAGNOSIS — M5124 Other intervertebral disc displacement, thoracic region: Secondary | ICD-10-CM | POA: Diagnosis not present

## 2021-10-28 MED ORDER — IOHEXOL 300 MG/ML  SOLN
30.0000 mL | Freq: Once | INTRAMUSCULAR | Status: AC | PRN
  Administered 2021-10-28: 10 mL

## 2021-10-28 MED ORDER — LIDOCAINE HCL (PF) 1 % IJ SOLN
10.0000 mL | Freq: Once | INTRAMUSCULAR | Status: AC
  Administered 2021-10-28: 10 mL

## 2021-11-04 ENCOUNTER — Other Ambulatory Visit (HOSPITAL_BASED_OUTPATIENT_CLINIC_OR_DEPARTMENT_OTHER): Payer: Self-pay

## 2021-11-04 DIAGNOSIS — G96198 Other disorders of meninges, not elsewhere classified: Secondary | ICD-10-CM | POA: Diagnosis not present

## 2021-11-04 DIAGNOSIS — M546 Pain in thoracic spine: Secondary | ICD-10-CM | POA: Diagnosis not present

## 2021-11-04 MED ORDER — TRAMADOL HCL 50 MG PO TABS
ORAL_TABLET | ORAL | 0 refills | Status: DC
  Filled 2021-11-04: qty 30, 7d supply, fill #0

## 2021-11-16 ENCOUNTER — Other Ambulatory Visit (HOSPITAL_BASED_OUTPATIENT_CLINIC_OR_DEPARTMENT_OTHER): Payer: Self-pay

## 2021-11-28 NOTE — Progress Notes (Unsigned)
Referring Physician:  Reche Dixon, PA-C [address] [place],  [place] [address]  Primary Physician:  Sofie Hartigan, MD  History of Present Illness: 11/29/2021 [name] is here today with a chief complaint of mid right side back pain.  He has been having problems for more than a year but has been worse since March 2023.  He describes pain particular when he bends forward that occurs in his lower rib-bearing spine and extends into the right side of his abdomen.  He initially thought he might have a kidney stone, but he does not have significant kidney stones on imaging evaluation.  He reports discomfort and stiffness as bad as 8 out of 10.  Muscle relaxation helps.  His pain is constant.  He specifically denies any problems with balance or walking.  He has no tingling or numbness in his legs.    Bowel/Bladder Dysfunction: none  Conservative measures:  Physical therapy: has participated in at [facility] from 09/13/21 to 09/22/21 without relief.  Multimodal medical therapy including regular antiinflammatories:  tramadol, hydrocodone, meloxicam, prednisone, tizanidine, tylenol Injections:  has not had any epidural steroid injections  Past Surgery: denies  Darrell Kelley has no symptoms of cervical myelopathy.  The symptoms are causing a significant impact on the patient's life.   Review of Systems:  A 10 point review of systems is negative, except for the pertinent positives and negatives detailed in the HPI.  Past Medical History: Past Medical History:  Diagnosis Date   History of kidney stones    Wears hearing aid in both ears     Past Surgical History: Past Surgical History:  Procedure Laterality Date   ADENOIDECTOMY     CHOLECYSTECTOMY     COLONOSCOPY WITH PROPOFOL N/A 07/28/2019   Procedure: COLONOSCOPY WITH BIOPSY;  Surgeon: Lucilla Lame, MD;  Location: [place];  Service: Endoscopy;  Laterality: N/A;   TONSILLECTOMY       Allergies: Allergies as of 11/29/2021 - Review Complete 11/29/2021  Allergen Reaction Noted   Egg [eggs or egg-derived products] Anaphylaxis 07/21/2019   Penicillins Hives 03/29/2016   Sulfa antibiotics Hives 03/29/2016   Wheat bran Diarrhea 07/21/2019    Medications: Current Meds  Medication Sig   acetaminophen (TYLENOL) 500 MG tablet Take 1,000 mg by mouth every 8 (eight) hours as needed.   EPINEPHrine 0.3 mg/0.3 mL IJ SOAJ injection Inject 0.3 mg into the muscle as needed for anaphylaxis.   escitalopram (LEXAPRO) 5 MG tablet Take 5 mg by mouth daily.   meloxicam (MOBIC) 7.5 MG tablet Take 7.5 mg by mouth 2 (two) times daily.   sertraline (ZOLOFT) 100 MG tablet Take 200 mg by mouth daily.   tiZANidine (ZANAFLEX) 2 MG tablet Take 1 tablet by mouth every 6 (six) hours as needed for muscle spasms.   traMADol (ULTRAM) 50 MG tablet Take 1 tablet (50 mg total) by mouth every 6 (six) hours as needed for pain for up to 30 doses   [DISCONTINUED] meloxicam (MOBIC) 15 MG tablet Take 1 tablet (15 mg total) by mouth once daily    Social History: Social History   Tobacco Use   Smoking status: Never   Smokeless tobacco: Never  Vaping Use   Vaping Use: Never used  Substance Use Topics   Alcohol use: No   Drug use: No    Family Medical History: Family History  Problem Relation Age of Onset   Hypertension Mother     Physical Examination: Vitals:  11/29/21 0903  BP: 128/80    General: Patient is well developed, well nourished, calm, collected, and in no apparent distress. Attention to examination is appropriate.  Neck:   Supple.  Full range of motion.  Respiratory: Patient is breathing without any difficulty.   NEUROLOGICAL:     Awake, alert, oriented to person, place, and time.  Speech is clear and fluent. Fund of knowledge is appropriate.   Cranial Nerves: Pupils equal round and reactive to light.  Facial tone is symmetric.  Facial sensation is symmetric. Shoulder  shrug is symmetric. Tongue protrusion is midline.  There is no pronator drift.  ROM of spine: full.    Strength: Side Biceps Triceps Deltoid Interossei Grip Wrist Ext. Wrist Flex.  R '5 5 5 5 5 5 5  '$ L '5 5 5 5 5 5 5   '$ Side Iliopsoas Quads Hamstring PF DF EHL  R '5 5 5 5 5 5  '$ L '5 5 5 5 5 5   '$ Reflexes are 1+ and symmetric at the biceps, triceps, brachioradialis, patella and achilles.   Hoffman's is absent.  Clonus is not present.  Toes are down-going.  Bilateral upper and lower extremity sensation is intact to light touch.    No evidence of dysmetria noted.  Gait is normal.     Medical Decision Making  Imaging: MR Thoracic spine 10/03/21 IMPRESSION: 1. Subtle anterior displacement with flattening of the posterior margin of the mid thoracic cord at the levels of T6-7 through T8-9. Finding of uncertain etiology, but could be due to an underlying arachnoid web or cyst. No frank kinking to suggest spinal cord herniation. No associated cord signal changes. While this finding is of uncertain significance, this could be further assessed with dedicated CT myelogram as clinically warranted. 2. Small left paracentral disc protrusion at T4-5 without significant stenosis or impingement. 3. Additional mild noncompressive disc bulging elsewhere within the thoracic spine without significant stenosis or neural impingement.     Electronically Signed   By: Jeannine Boga M.D.   On: 10/04/2021 07:10  CT Thoracic spine myelogram 10/28/21 IMPRESSION: 1. 1.5 x 0.6 x 11 cm cystic mass opacifying with contrast along the dorsal aspect of the thoracic spinal cord at the level of T7-8 through T11-12 deforming the dorsal aspect of the spinal cord particularly at T8-T10. Overall appearance is most consistent with an arachnoid cyst.     Electronically Signed   By: Kathreen Devoid M.D.   On: 10/28/2021 10:26  I have personally reviewed the images and agree with the above  interpretation.  Assessment and Plan: [name] is a pleasant 56 y.o. male with lower back pain without clear radicular features who also has an intradural thoracic arachnoid cyst.  He does not have thoracic myelopathy.  It is not clear to me that his thoracic cyst is the cause of his symptoms.  He has been having symptoms for more than a year.  I think it is time for him to get a lumbar spine MRI scan, as this may be facet mediated or due to some pathology in the lumbar spine.  I think his best initial course of action is to consider injections.  I will contact one of the pain doctors to see if he can be evaluated.     I spent a total of 30 minutes in face-to-face and non-face-to-face activities related to this patient's care today.  Thank you for involving me in the care of this patient.  Dareld Mcauliffe K. Izora Ribas MD, [facility] Neurosurgery

## 2021-11-29 ENCOUNTER — Encounter: Payer: Self-pay | Admitting: Neurosurgery

## 2021-11-29 ENCOUNTER — Ambulatory Visit: Payer: 59 | Admitting: Neurosurgery

## 2021-11-29 VITALS — BP 128/80 | Ht 72.0 in | Wt 231.0 lb

## 2021-11-29 DIAGNOSIS — G8929 Other chronic pain: Secondary | ICD-10-CM

## 2021-11-29 DIAGNOSIS — Q349 Congenital malformation of respiratory system, unspecified: Secondary | ICD-10-CM

## 2021-11-29 DIAGNOSIS — M545 Low back pain, unspecified: Secondary | ICD-10-CM

## 2021-12-07 ENCOUNTER — Ambulatory Visit
Admission: RE | Admit: 2021-12-07 | Discharge: 2021-12-07 | Disposition: A | Discharge status: Home / Self Care | Payer: 59 | Source: Ambulatory Visit | Attending: Neurosurgery | Admitting: Neurosurgery

## 2021-12-07 DIAGNOSIS — M5127 Other intervertebral disc displacement, lumbosacral region: Secondary | ICD-10-CM | POA: Diagnosis not present

## 2021-12-07 DIAGNOSIS — M545 Low back pain, unspecified: Secondary | ICD-10-CM

## 2021-12-07 DIAGNOSIS — M47816 Spondylosis without myelopathy or radiculopathy, lumbar region: Secondary | ICD-10-CM | POA: Diagnosis not present

## 2021-12-07 DIAGNOSIS — M4807 Spinal stenosis, lumbosacral region: Secondary | ICD-10-CM | POA: Diagnosis not present

## 2021-12-08 ENCOUNTER — Ambulatory Visit: Payer: 59 | Admitting: Anesthesiology

## 2021-12-13 ENCOUNTER — Ambulatory Visit
Admission: RE | Admit: 2021-12-13 | Discharge: 2021-12-13 | Disposition: A | Discharge status: Home / Self Care | Payer: 59 | Source: Ambulatory Visit | Attending: Anesthesiology | Admitting: Anesthesiology

## 2021-12-13 ENCOUNTER — Encounter: Payer: Self-pay | Admitting: Anesthesiology

## 2021-12-13 ENCOUNTER — Ambulatory Visit (HOSPITAL_BASED_OUTPATIENT_CLINIC_OR_DEPARTMENT_OTHER): Payer: 59 | Admitting: Anesthesiology

## 2021-12-13 ENCOUNTER — Other Ambulatory Visit: Payer: Self-pay | Admitting: Anesthesiology

## 2021-12-13 VITALS — BP 156/111 | HR 81 | Resp 16 | Ht 72.0 in | Wt 230.0 lb

## 2021-12-13 DIAGNOSIS — M47817 Spondylosis without myelopathy or radiculopathy, lumbosacral region: Secondary | ICD-10-CM | POA: Insufficient documentation

## 2021-12-13 DIAGNOSIS — R52 Pain, unspecified: Secondary | ICD-10-CM | POA: Insufficient documentation

## 2021-12-13 DIAGNOSIS — R109 Unspecified abdominal pain: Secondary | ICD-10-CM | POA: Insufficient documentation

## 2021-12-13 DIAGNOSIS — G930 Cerebral cysts: Secondary | ICD-10-CM | POA: Insufficient documentation

## 2021-12-13 DIAGNOSIS — G8929 Other chronic pain: Secondary | ICD-10-CM | POA: Diagnosis not present

## 2021-12-13 DIAGNOSIS — M5414 Radiculopathy, thoracic region: Secondary | ICD-10-CM | POA: Insufficient documentation

## 2021-12-13 DIAGNOSIS — M5136 Other intervertebral disc degeneration, lumbar region: Secondary | ICD-10-CM

## 2021-12-13 MED ORDER — ROPIVACAINE HCL 2 MG/ML IJ SOLN
10.0000 mL | Freq: Once | INTRAMUSCULAR | Status: AC
  Administered 2021-12-13: 10 mL via EPIDURAL

## 2021-12-13 MED ORDER — LIDOCAINE HCL (PF) 1 % IJ SOLN
5.0000 mL | Freq: Once | INTRAMUSCULAR | Status: AC
  Administered 2021-12-13: 5 mL via SUBCUTANEOUS

## 2021-12-13 MED ORDER — ROPIVACAINE HCL 2 MG/ML IJ SOLN
INTRAMUSCULAR | Status: AC
  Filled 2021-12-13: qty 20

## 2021-12-13 MED ORDER — IOHEXOL 180 MG/ML  SOLN
INTRAMUSCULAR | Status: AC
  Filled 2021-12-13: qty 20

## 2021-12-13 MED ORDER — LIDOCAINE HCL (PF) 1 % IJ SOLN
INTRAMUSCULAR | Status: AC
  Filled 2021-12-13: qty 10

## 2021-12-13 MED ORDER — TRIAMCINOLONE ACETONIDE 40 MG/ML IJ SUSP
40.0000 mg | Freq: Once | INTRAMUSCULAR | Status: AC
  Administered 2021-12-13: 40 mg

## 2021-12-13 MED ORDER — TRIAMCINOLONE ACETONIDE 40 MG/ML IJ SUSP
INTRAMUSCULAR | Status: AC
  Filled 2021-12-13: qty 1

## 2021-12-13 MED ORDER — SODIUM CHLORIDE (PF) 0.9 % IJ SOLN
INTRAMUSCULAR | Status: AC
  Filled 2021-12-13: qty 10

## 2021-12-13 MED ORDER — SODIUM CHLORIDE 0.9% FLUSH
10.0000 mL | Freq: Once | INTRAVENOUS | Status: AC
  Administered 2021-12-13: 10 mL

## 2021-12-13 MED ORDER — IOHEXOL 180 MG/ML  SOLN
10.0000 mL | Freq: Once | INTRAMUSCULAR | Status: AC | PRN
  Administered 2021-12-13: 10 mL via EPIDURAL
  Filled 2021-12-13: qty 20

## 2021-12-13 NOTE — Progress Notes (Signed)
Safety precautions to be maintained throughout the outpatient stay will include: orient to surroundings, keep bed in low position, maintain call bell within reach at all times, provide assistance with transfer out of bed and ambulation.  

## 2021-12-14 ENCOUNTER — Telehealth: Payer: Self-pay

## 2021-12-14 NOTE — Telephone Encounter (Signed)
Post procedure phone call.  LM 

## 2021-12-14 NOTE — Telephone Encounter (Signed)
He called back and I asked him how he was doing after procedure yesterday. He said when the numbing medicine wore off it started hurting bad. He did have to take a tramadol. I told him it could take several days before the steroids starting working good. 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  No redness, swelling or heat at site.

## 2021-12-15 ENCOUNTER — Encounter: Payer: Self-pay | Admitting: Anesthesiology

## 2021-12-15 NOTE — Progress Notes (Signed)
Subjective:  Patient ID: Darrell Kelley, male    DOB: 03-06-1966  Age: 56 y.o. MRN: [id number]  CC: Back Pain (low)     PROCEDURE: L1-L2 lumbar epidural steroid under fluoroscopic guidance with no sedation  HPI Darrell Kelley presents for new patient evaluation.  Darrell Kelley has a history of low back pain of a chronic nature that is intermittent in severity.  He does physical therapy for this and occasionally has some sciatica symptoms but that pain is generally well controlled at this point.  He is referred by Dr. Elayne Guerin secondary to thoracic pain that has been unremitting.  This thoracic pain has radiated from the right flank and right mid thoracic region into the umbilical region on the right side.  He reports that he cannot get relief from this.  Nothing is of benefit.  He has tried physical therapy and anti-inflammatory medications without significant improvement.  A work-up per Dr. Cari Caraway and neurology revealed evidence of a cystic mass running from approximately T7-T8 region down to T11.  This was evaluated with CT myelogram and reviewed by me following the referral today.  He denies upper or lower extremity loss of strength or function.  Primary pain complaint is an aching gnawing radiating pain with lancinating qualities radiate from the mid back and occasionally shooting into the umbilical region.  Despite conservative therapy this pain persist.  Dr. Cari Caraway would like [place] to perform a thoracic epidural to see if there is a musculoskeletal component to the radiating pain prior to consideration for surgical intervention.  History Darrell Kelley has a past medical history of History of kidney stones and Wears hearing aid in both ears.   He has a past surgical history that includes Tonsillectomy; Cholecystectomy; Adenoidectomy; and Colonoscopy with propofol (N/A, 07/28/2019).   His family history includes Hypertension in his mother.He reports that he has never smoked. He  has never used smokeless tobacco. He reports that he does not drink alcohol and does not use drugs.  No valid procedures specified.  No results found for: "TOXASSSELUR"  Outpatient Medications Prior to Visit  Medication Sig Dispense Refill   acetaminophen (TYLENOL) 500 MG tablet Take 1,000 mg by mouth every 8 (eight) hours as needed.     EPINEPHrine 0.3 mg/0.3 mL IJ SOAJ injection Inject 0.3 mg into the muscle as needed for anaphylaxis.     escitalopram (LEXAPRO) 5 MG tablet Take 5 mg by mouth daily.     meloxicam (MOBIC) 7.5 MG tablet Take 7.5 mg by mouth 2 (two) times daily.     sertraline (ZOLOFT) 100 MG tablet Take 200 mg by mouth daily.     tiZANidine (ZANAFLEX) 2 MG tablet Take 1 tablet by mouth every 6 (six) hours as needed for muscle spasms.     traMADol (ULTRAM) 50 MG tablet Take 1 tablet (50 mg total) by mouth every 6 (six) hours as needed for pain for up to 30 doses 30 tablet 0   No facility-administered medications prior to visit.   No results found for: "WBC", "HGB", "HCT", "PLT", "GLUCOSE", "CHOL", "TRIG", "HDL", "LDLDIRECT", "[place]", "ALT", "AST", "NA", "K", "CL", "CREATININE", "BUN", "CO2", "TSH", "PSA", "INR", "GLUF", "HGBA1C", "MICROALBUR"  --------------------------------------------------------------------------------------------------------------------- DG PAIN CLINIC C-ARM 1-60 MIN NO REPORT  Result Date: 12/13/2021 Fluoro was used, but no Radiologist interpretation will be provided. Please refer to "NOTES" tab for provider progress note.      ---------------------------------------------------------------------------------------------------------------------- Past Medical History:  Diagnosis Date   History of kidney stones    Wears  hearing aid in both ears     Past Surgical History:  Procedure Laterality Date   ADENOIDECTOMY     CHOLECYSTECTOMY     COLONOSCOPY WITH PROPOFOL N/A 07/28/2019   Procedure: COLONOSCOPY WITH BIOPSY;  Surgeon: Lucilla Lame, MD;   Location: [facility];  Service: Endoscopy;  Laterality: N/A;   TONSILLECTOMY      Family History  Problem Relation Age of Onset   Hypertension Mother     Social History   Tobacco Use   Smoking status: Never   Smokeless tobacco: Never  Substance Use Topics   Alcohol use: No    ---------------------------------------------------------------------------------------------------------------------  Scheduled Meds: Continuous Infusions: PRN Meds:.   BP (!) 156/111   Pulse 81   Resp 16   Ht 6' (1.829 m)   Wt 230 lb (104.3 kg)   SpO2 99%   BMI 31.19 kg/m    BP Readings from Last 3 Encounters:  12/13/21 (!) 156/111  11/29/21 128/80  10/28/21 118/80     Wt Readings from Last 3 Encounters:  12/13/21 230 lb (104.3 kg)  11/29/21 231 lb (104.8 kg)  10/28/21 225 lb (102.1 kg)     ----------------------------------------------------------------------------------------------------------------------  ROS Review of Systems CNS: No loss of strength in upper or lower extremities.  No headaches.  Positive backache. Cardiac: No angina or palpitations Pulmonary: No shortness of breath or wheezing GI: No constipation or obstipation no nausea vomiting GU: No hematuria or urinary hesitancy or dysfunction  musculoskeletal: As above Objective:  BP (!) 156/111   Pulse 81   Resp 16   Ht 6' (1.829 m)   Wt 230 lb (104.3 kg)   SpO2 99%   BMI 31.19 kg/m   Physical Exam Patient is alert oriented good historian cooperative compliant Pupils are equally round reactive light extraocular muscle intact Heart is regular rate and rhythm without murmur or rub Lungs are clear to auscultation no wheezes or rales Some mild paraspinous muscle tenderness in the thoracic region but no overt trigger points.  Good range of motion.  No cutaneous posterior back findings.  I am unable to reproduce his primary pain complaint through palpation.  He is able to flex and extend at the thoracic  back with only mild restriction. No sciatic symptoms and good muscle tone and bulk to the lower extremities with good strength intact.  He ambulates with a normal gait.     Assessment & Plan:   Darrell Kelley was seen today for back pain.  Diagnoses and all orders for this visit:  DDD (degenerative disc disease), lumbar  Lumbosacral spondylosis without myelopathy  Facet arthritis of lumbosacral region  Radicular pain of thoracic region  Right flank pain, chronic  Arachnoid cyst  Other orders -     triamcinolone acetonide (KENALOG-40) injection 40 mg -     sodium chloride flush (NS) 0.9 % injection 10 mL -     ropivacaine (PF) 2 mg/mL (0.2%) (NAROPIN) injection 10 mL -     lidocaine (PF) (XYLOCAINE) 1 % injection 5 mL -     iohexol (OMNIPAQUE) 180 MG/ML injection 10 mL     ----------------------------------------------------------------------------------------------------------------------  Problem List Items Addressed This Visit       Unprioritized   Arachnoid cyst   Other Visit Diagnoses     DDD (degenerative disc disease), lumbar    -  Primary   Relevant Medications   triamcinolone acetonide (KENALOG-40) injection 40 mg (Completed)   Lumbosacral spondylosis without myelopathy       Relevant  Medications   triamcinolone acetonide (KENALOG-40) injection 40 mg (Completed)   Facet arthritis of lumbosacral region       Relevant Medications   triamcinolone acetonide (KENALOG-40) injection 40 mg (Completed)   Radicular pain of thoracic region       Relevant Medications   triamcinolone acetonide (KENALOG-40) injection 40 mg (Completed)   Right flank pain, chronic       Relevant Medications   triamcinolone acetonide (KENALOG-40) injection 40 mg (Completed)   ropivacaine (PF) 2 mg/mL (0.2%) (NAROPIN) injection 10 mL (Completed)   lidocaine (PF) (XYLOCAINE) 1 % injection 5 mL (Completed)        ----------------------------------------------------------------------------------------------------------------------  1. DDD (degenerative disc disease), lumbar Continue core stretching strengthening exercises as reviewed today  2. Lumbosacral spondylosis without myelopathy As above  3. Facet arthritis of lumbosacral region As above  4. Radicular pain of thoracic region I had a long discussion with Darrell Kelley regarding options for interventional pain management considering the nature of his condition.  I have talked to Dr. Cari Caraway at length regarding his condition and we feel that a an L1-2 lumbar epidural steroid injection may be of therapeutic benefit and diagnostic and purpose to see if there is a musculoskeletal component beyond the cystic compression affecting the lancinating persistent pain that Darrell Kelley has been experiencing.  We gone over the risks of the procedure at great length including worsening of pain and failure to gain relief.  Furthermore we like to go below the cystic lesion to avoid that.  He feels that he cannot gain any relief and would like to avoid surgery and is desiring to proceed with an epidural steroid to see if he can gain some improvement and relief with this.  Depending on his response to therapy we may proceed with a repeat epidural or for possible potential follow-up with Dr. Cari Caraway for decortication of the cyst.  5. Right flank pain, chronic As above  6. Arachnoid cyst     ----------------------------------------------------------------------------------------------------------------------  I am having Darrell Kelley. Darrell Kelley maintain his sertraline, escitalopram, EPINEPHrine, tiZANidine, acetaminophen, traMADol, and meloxicam. We administered triamcinolone acetonide, sodium chloride flush, ropivacaine (PF) 2 mg/mL (0.2%), lidocaine (PF), and iohexol.   Meds ordered this encounter  Medications   triamcinolone acetonide (KENALOG-40) injection 40 mg    sodium chloride flush (NS) 0.9 % injection 10 mL   ropivacaine (PF) 2 mg/mL (0.2%) (NAROPIN) injection 10 mL   lidocaine (PF) (XYLOCAINE) 1 % injection 5 mL   iohexol (OMNIPAQUE) 180 MG/ML injection 10 mL   Procedure: L1-2 LESI with fluoroscopic guidance and no moderate sedation  NOTE: The risks, benefits, and expectations of the procedure have been discussed and explained to the patient who was understanding and in agreement with suggested treatment plan. No guarantees were made.  DESCRIPTION OF PROCEDURE: Lumbar epidural steroid injection with no IV Versed, EKG, blood pressure, pulse, and pulse oximetry monitoring. The procedure was performed with the patient in the prone position under fluoroscopic guidance.  Sterile prep x3 was initiated and I then injected subcutaneous lidocaine to the overlying L1 to site after its fluoroscopic identifictation.  Using strict aseptic technique, I then advanced an 18-gauge Tuohy epidural needle in the midline using interlaminar approach via loss-of-resistance to saline technique. There was negative aspiration for heme or  CSF.  I then confirmed position with both AP and Lateral fluoroscan.  2 cc of contrast dye were injected yielding good spread approximately 3 levels above needle site insertion and a  total of 5 mL of Preservative-Free  normal saline mixed with 40 mg of Kenalog and 1cc Ropicaine 0.2 percent were injected incrementally via the  epidurally placed needle.  This did yield transient reproduction of his primary pain complaint.  It resolved spontaneously.  The needle was removed. The patient tolerated the injection well and was convalesced and discharged to home in stable condition. Should the patient have any post procedure difficulty they have been instructed on how to contact us for assistance.       Follow-up: Return in about 3 weeks (around 01/03/2022), or if symptoms worsen or fail to improve, for evaluation, med refill.    Molli Barrows,  MD 5:34 PM  The [place] practitioner database for opioid medications on this patient has been reviewed by me and my staff   Greater than 50% of the total encounter time was spent in counseling and / or coordination of care.     This dictation was performed utilizing Systems analyst.  Please excuse any unintentional or mistaken typographical errors as a result.

## 2021-12-19 ENCOUNTER — Telehealth: Payer: Self-pay | Admitting: Anesthesiology

## 2021-12-19 NOTE — Telephone Encounter (Signed)
Patient reassurred that it has only been 5 days since LESI, expected that the would continue to have pain at this point.

## 2021-12-19 NOTE — Telephone Encounter (Signed)
Patient stated since he had the injection done the pain has gotten worse. Please give patient a call. Thanks

## 2022-01-03 ENCOUNTER — Encounter: Payer: 59 | Admitting: Anesthesiology

## 2022-01-11 ENCOUNTER — Telehealth: Payer: Self-pay | Admitting: Anesthesiology

## 2022-01-11 NOTE — Telephone Encounter (Signed)
Patient is interested in having another lesi. We need an order before we can schedule him for one.there is a note in the d/c for lesi but not order. Please ask Dr. Andree Elk to put in order

## 2022-01-19 ENCOUNTER — Ambulatory Visit: Payer: 59 | Admitting: Student in an Organized Health Care Education/Training Program

## 2022-01-26 ENCOUNTER — Ambulatory Visit: Payer: 59 | Attending: Anesthesiology | Admitting: Anesthesiology

## 2022-01-26 ENCOUNTER — Encounter: Payer: Self-pay | Admitting: Anesthesiology

## 2022-01-26 VITALS — BP 132/83 | HR 94 | Resp 18 | Ht 72.0 in | Wt 230.0 lb

## 2022-01-26 DIAGNOSIS — G930 Cerebral cysts: Secondary | ICD-10-CM | POA: Diagnosis not present

## 2022-01-26 DIAGNOSIS — R109 Unspecified abdominal pain: Secondary | ICD-10-CM | POA: Insufficient documentation

## 2022-01-26 DIAGNOSIS — M5136 Other intervertebral disc degeneration, lumbar region: Secondary | ICD-10-CM | POA: Diagnosis not present

## 2022-01-26 DIAGNOSIS — M47817 Spondylosis without myelopathy or radiculopathy, lumbosacral region: Secondary | ICD-10-CM | POA: Diagnosis not present

## 2022-01-26 DIAGNOSIS — M546 Pain in thoracic spine: Secondary | ICD-10-CM | POA: Insufficient documentation

## 2022-01-26 DIAGNOSIS — G894 Chronic pain syndrome: Secondary | ICD-10-CM | POA: Insufficient documentation

## 2022-01-26 DIAGNOSIS — M5414 Radiculopathy, thoracic region: Secondary | ICD-10-CM | POA: Insufficient documentation

## 2022-01-26 DIAGNOSIS — G8929 Other chronic pain: Secondary | ICD-10-CM | POA: Diagnosis not present

## 2022-01-26 NOTE — Progress Notes (Signed)
Safety precautions to be maintained throughout the outpatient stay will include: orient to surroundings, keep bed in low position, maintain call bell within reach at all times, provide assistance with transfer out of bed and ambulation.  

## 2022-01-26 NOTE — Progress Notes (Signed)
Subjective:  Patient ID: Darrell Kelley, male    DOB: 05-Dec-1965  Age: 56 y.o. MRN: [id number]  CC: Back Pain (Thoracic)   Procedure: None    HPI Darrell Kelley presents for reeval.  No change or improvement in his thoracic back pain is noted.  The quality and character as well as distribution are the same.  He still takes his nsaids and tylenol successfully and this keeps the pain reasonable.  Otherwise his pain is limiting and worse with increased work requireements but he continues to stay active as tolerated.   Outpatient Medications Prior to Visit  Medication Sig Dispense Refill   acetaminophen (TYLENOL) 500 MG tablet Take 1,000 mg by mouth every 8 (eight) hours as needed.     EPINEPHrine 0.3 mg/0.3 mL IJ SOAJ injection Inject 0.3 mg into the muscle as needed for anaphylaxis.     escitalopram (LEXAPRO) 5 MG tablet Take 5 mg by mouth daily.     meloxicam (MOBIC) 7.5 MG tablet Take 7.5 mg by mouth 2 (two) times daily.     sertraline (ZOLOFT) 100 MG tablet Take 200 mg by mouth daily.     tiZANidine (ZANAFLEX) 2 MG tablet Take 1 tablet by mouth every 6 (six) hours as needed for muscle spasms.     traMADol (ULTRAM) 50 MG tablet Take 1 tablet (50 mg total) by mouth every 6 (six) hours as needed for pain for up to 30 doses 30 tablet 0   No facility-administered medications prior to visit.    Review of Systems CNS: No confusion or sedation Cardiac: No angina or palpitations GI: No abdominal pain or constipation Constitutional: No nausea vomiting fevers or chills  Objective:  BP 132/83   Pulse 94   Resp 18   Ht 6' (1.829 m)   Wt 230 lb (104.3 kg)   SpO2 97%   BMI 31.19 kg/m    BP Readings from Last 3 Encounters:  01/26/22 132/83  12/13/21 (!) 156/111  11/29/21 128/80     Wt Readings from Last 3 Encounters:  01/26/22 230 lb (104.3 kg)  12/13/21 230 lb (104.3 kg)  11/29/21 231 lb (104.8 kg)     Physical Exam Pt is alert and oriented PERRL EOMI HEART  IS RRR no murmur or rub LCTA no wheezing or rales MUSCULOSKELETALsome paraspinous tenderness in the thoracic region but otherwise no significant changes on exam.  Labs  No results found for: "HGBA1C" No results found for: "GLUF", "MICROALBUR", "[place]", "CREATININE"  -------------------------------------------------------------------------------------------------------------------- No results found for: "WBC", "HGB", "HCT", "PLT", "GLUCOSE", "CHOL", "TRIG", "HDL", "LDLDIRECT", "LDLCALC", "ALT", "AST", "NA", "K", "CL", "CREATININE", "BUN", "CO2", "TSH", "PSA", "INR", "GLUF", "HGBA1C", "MICROALBUR"  --------------------------------------------------------------------------------------------------------------------- DG PAIN CLINIC C-ARM 1-60 MIN NO REPORT  Result Date: 12/13/2021 Fluoro was used, but no Radiologist interpretation will be provided. Please refer to "NOTES" tab for provider progress note.    Assessment & Plan:   Darrell Kelley was seen today for back pain.  Diagnoses and all orders for this visit:  DDD (degenerative disc disease), lumbar  Lumbosacral spondylosis without myelopathy  Facet arthritis of lumbosacral region  Radicular pain of thoracic region  Right flank pain, chronic  Arachnoid cyst  Chronic right-sided thoracic back pain  Chronic pain syndrome        ----------------------------------------------------------------------------------------------------------------------  Problem List Items Addressed This Visit       Unprioritized   Arachnoid cyst   Other Visit Diagnoses     DDD (degenerative disc disease), lumbar    -  Primary   Lumbosacral spondylosis without myelopathy       Facet arthritis of lumbosacral region       Radicular pain of thoracic region       Right flank pain, chronic       Chronic right-sided thoracic back pain       Chronic pain syndrome              ----------------------------------------------------------------------------------------------------------------------  1. DDD (degenerative disc disease), lumbar Cont core stretching and strengthening   2. Lumbosacral spondylosis without myelopathy As above  3. Facet arthritis of lumbosacral region   4. Radicular pain of thoracic region   5. Right flank pain, chronic   6. Arachnoid cyst Follow up with Dr. Daun Peacock for reeval.  7. Chronic right-sided thoracic back pain   8. Chronic pain syndrome I have discussed options with Darrell Kelley regarding DCS should this pain persist.     ----------------------------------------------------------------------------------------------------------------------  I am having Darrell Kelley maintain his sertraline, escitalopram, EPINEPHrine, tiZANidine, acetaminophen, traMADol, and meloxicam.   No orders of the defined types were placed in this encounter.  Patient's Medications  New Prescriptions   No medications on file  Previous Medications   ACETAMINOPHEN (TYLENOL) 500 MG TABLET    Take 1,000 mg by mouth every 8 (eight) hours as needed.   EPINEPHRINE 0.3 MG/0.3 ML IJ SOAJ INJECTION    Inject 0.3 mg into the muscle as needed for anaphylaxis.   ESCITALOPRAM (LEXAPRO) 5 MG TABLET    Take 5 mg by mouth daily.   MELOXICAM (MOBIC) 7.5 MG TABLET    Take 7.5 mg by mouth 2 (two) times daily.   SERTRALINE (ZOLOFT) 100 MG TABLET    Take 200 mg by mouth daily.   TIZANIDINE (ZANAFLEX) 2 MG TABLET    Take 1 tablet by mouth every 6 (six) hours as needed for muscle spasms.   TRAMADOL (ULTRAM) 50 MG TABLET    Take 1 tablet (50 mg total) by mouth every 6 (six) hours as needed for pain for up to 30 doses  Modified Medications   No medications on file  Discontinued Medications   No medications on file   ----------------------------------------------------------------------------------------------------------------------  Follow-up:  Return if symptoms worsen or fail to improve, for evaluation.    Molli Barrows, MD

## 2022-02-02 ENCOUNTER — Ambulatory Visit: Payer: 59 | Admitting: Neurosurgery

## 2022-02-06 DIAGNOSIS — Z23 Encounter for immunization: Secondary | ICD-10-CM | POA: Diagnosis not present

## 2022-02-06 DIAGNOSIS — R0989 Other specified symptoms and signs involving the circulatory and respiratory systems: Secondary | ICD-10-CM | POA: Diagnosis not present

## 2022-02-06 DIAGNOSIS — U071 COVID-19: Secondary | ICD-10-CM | POA: Diagnosis not present

## 2022-02-07 ENCOUNTER — Telehealth: Payer: Self-pay

## 2022-02-07 ENCOUNTER — Ambulatory Visit: Payer: 59 | Admitting: Neurosurgery

## 2022-02-07 NOTE — Telephone Encounter (Signed)
-----   Message from Peggyann Shoals sent at 02/07/2022 10:21 AM EDT ----- Regarding: virtual visit Contact: [phone] This patient called yesterday and cancelled because he tested positive for covid. He is rescheduled to 03/21/22 and added to the cancellation list. He says that he really needs to be seen sooner. He is asking if Dr.Yarbrough could do a virtual visit. I told him I would ask.

## 2022-02-07 NOTE — Telephone Encounter (Signed)
Thoughts?

## 2022-02-14 ENCOUNTER — Ambulatory Visit: Payer: 59 | Admitting: Neurosurgery

## 2022-02-14 DIAGNOSIS — M5414 Radiculopathy, thoracic region: Secondary | ICD-10-CM

## 2022-02-14 DIAGNOSIS — G930 Cerebral cysts: Secondary | ICD-10-CM | POA: Diagnosis not present

## 2022-02-14 NOTE — Telephone Encounter (Signed)
Patient scheduled for phone visit today 02/14/2022.

## 2022-02-14 NOTE — H&P (View-Only) (Signed)
Referring Physician:  Sofie Hartigan, MD [address],  [place] [address]  Primary Physician:  Sofie Hartigan, MD  History of Present Illness: 02/14/2022 [facility] continues to have back and ribcage pain.  He has tried an epidural injection without   11/29/21 [facility] is here today with a chief complaint of mid right side back pain.  He has been having problems for more than a year but has been worse since March 2023.  He describes pain particular when he bends forward that occurs in his lower rib-bearing spine and extends into the right side of his abdomen.  He initially thought he might have a kidney stone, but he does not have significant kidney stones on imaging evaluation.  He reports discomfort and stiffness as bad as 8 out of 10.  Muscle relaxation helps.  His pain is constant.  He specifically denies any problems with balance or walking.  He has no tingling or numbness in his legs.    Bowel/Bladder Dysfunction: none  Conservative measures:  Physical therapy: has participated in at [facility] from 09/13/21 to 09/22/21 without relief.  Multimodal medical therapy including regular antiinflammatories:  tramadol, hydrocodone, meloxicam, prednisone, tizanidine, tylenol Injections:  has not had any epidural steroid injections  Past Surgery: denies  [facility] has no symptoms of cervical myelopathy.  The symptoms are causing a significant impact on the patient's life.   Review of Systems:  A 10 point review of systems is negative, except for the pertinent positives and negatives detailed in the HPI.  Past Medical History: Past Medical History:  Diagnosis Date   History of kidney stones    Wears hearing aid in both ears     Past Surgical History: Past Surgical History:  Procedure Laterality Date   ADENOIDECTOMY     CHOLECYSTECTOMY     COLONOSCOPY WITH PROPOFOL N/A 07/28/2019   Procedure: COLONOSCOPY WITH BIOPSY;  Surgeon: Lucilla Lame, MD;   Location: [facility];  Service: Endoscopy;  Laterality: N/A;   TONSILLECTOMY      Allergies: Allergies as of 02/14/2022 - Review Complete 01/26/2022  Allergen Reaction Noted   Egg [eggs or egg-derived products] Anaphylaxis 07/21/2019   Penicillins Hives 03/29/2016   Sulfa antibiotics Hives 03/29/2016   Wheat bran Diarrhea 07/21/2019    Medications: No outpatient medications have been marked as taking for the 02/14/22 encounter (Office Visit) with Meade Maw, MD.    Social History: Social History   Tobacco Use   Smoking status: Never   Smokeless tobacco: Never  Vaping Use   Vaping Use: Never used  Substance Use Topics   Alcohol use: No   Drug use: No    Family Medical History: Family History  Problem Relation Age of Onset   Hypertension Mother     Physical Examination: Telephone visit   Medical Decision Making  Imaging: MR Thoracic spine 10/03/21 IMPRESSION: 1. Subtle anterior displacement with flattening of the posterior margin of the mid thoracic cord at the levels of T6-7 through T8-9. Finding of uncertain etiology, but could be due to an underlying arachnoid web or cyst. No frank kinking to suggest spinal cord herniation. No associated cord signal changes. While this finding is of uncertain significance, this could be further assessed with dedicated CT myelogram as clinically warranted. 2. Small left paracentral disc protrusion at T4-5 without significant stenosis or impingement. 3. Additional mild noncompressive disc bulging elsewhere within the thoracic spine without significant stenosis or neural impingement.  Electronically Signed   By: Jeannine Boga M.D.   On: 10/04/2021 07:10  CT Thoracic spine myelogram 10/28/21 IMPRESSION: 1. 1.5 x 0.6 x 11 cm cystic mass opacifying with contrast along the dorsal aspect of the thoracic spinal cord at the level of T7-8 through T11-12 deforming the dorsal aspect of the spinal  cord particularly at T8-T10. Overall appearance is most consistent with an arachnoid cyst.     Electronically Signed   By: Kathreen Devoid M.D.   On: 10/28/2021 10:26  I have personally reviewed the images and agree with the above interpretation.  Assessment and Plan: Darrell [facility] is a pleasant 56 y.o. male with midthoracic pain with features of thoracic radiculopathy.  He has an intradural arachnoid cyst in the thoracic spine.  He has tried injections without improvement.  His thoracic stenosis deforming the spinal cord and could be causing his symptoms.  After reviewing his symptomatology, I feel that intervention is indicated.  No further conservative management is likely to improve his current condition.    I recommended T8-9 laminectomy for fenestration of arachnoid cyst.    I discussed the planned procedure at length with the patient, including the risks, benefits, alternatives, and indications. The risks discussed include but are not limited to bleeding, infection, need for reoperation, spinal fluid leak, stroke, vision loss, anesthetic complication, coma, paralysis, and even death. I also described in detail that improvement was not guaranteed.  The patient expressed understanding of these risks, and asked that we proceed with surgery. I described the surgery in layman's terms, and gave ample opportunity for questions, which were answered to the best of my ability.  I discussed the planned procedure at length with the patient, including the risks, benefits, alternatives, and indications. The risks discussed include but are not limited to bleeding, infection, need for reoperation, spinal fluid leak, stroke, vision loss, anesthetic complication, coma, paralysis, and even death. I also described in detail that improvement was not guaranteed.  The patient expressed understanding of these risks, and asked that we proceed with surgery. I described the surgery in layman's terms, and gave ample  opportunity for questions, which were answered to the best of my ability.  This visit was performed via telephone.  Patient location: work Provider location: office  I spent a total of 20 minutes non-face-to-face activities for this visit on the date of this encounter including review of current clinical condition and response to treatment.     Gene Glazebrook K. Izora Ribas MD, [facility]

## 2022-02-14 NOTE — Progress Notes (Signed)
Referring Physician:  Sofie Hartigan, MD [address],  [place] [address]  Primary Physician:  Sofie Hartigan, MD  History of Present Illness: 02/14/2022 [facility] continues to have back and ribcage pain.  He has tried an epidural injection without   11/29/21 [facility] is here today with a chief complaint of mid right side back pain.  He has been having problems for more than a year but has been worse since March 2023.  He describes pain particular when he bends forward that occurs in his lower rib-bearing spine and extends into the right side of his abdomen.  He initially thought he might have a kidney stone, but he does not have significant kidney stones on imaging evaluation.  He reports discomfort and stiffness as bad as 8 out of 10.  Muscle relaxation helps.  His pain is constant.  He specifically denies any problems with balance or walking.  He has no tingling or numbness in his legs.    Bowel/Bladder Dysfunction: none  Conservative measures:  Physical therapy: has participated in at [facility] from 09/13/21 to 09/22/21 without relief.  Multimodal medical therapy including regular antiinflammatories:  tramadol, hydrocodone, meloxicam, prednisone, tizanidine, tylenol Injections:  has not had any epidural steroid injections  Past Surgery: denies  Darrell [facility] has no symptoms of cervical myelopathy.  The symptoms are causing a significant impact on the patient's life.   Review of Systems:  A 10 point review of systems is negative, except for the pertinent positives and negatives detailed in the HPI.  Past Medical History: Past Medical History:  Diagnosis Date   History of kidney stones    Wears hearing aid in both ears     Past Surgical History: Past Surgical History:  Procedure Laterality Date   ADENOIDECTOMY     CHOLECYSTECTOMY     COLONOSCOPY WITH PROPOFOL N/A 07/28/2019   Procedure: COLONOSCOPY WITH BIOPSY;  Surgeon: Lucilla Lame, MD;   Location: [facility];  Service: Endoscopy;  Laterality: N/A;   TONSILLECTOMY      Allergies: Allergies as of 02/14/2022 - Review Complete 01/26/2022  Allergen Reaction Noted   Egg [eggs or egg-derived products] Anaphylaxis 07/21/2019   Penicillins Hives 03/29/2016   Sulfa antibiotics Hives 03/29/2016   Wheat bran Diarrhea 07/21/2019    Medications: No outpatient medications have been marked as taking for the 02/14/22 encounter (Office Visit) with Meade Maw, MD.    Social History: Social History   Tobacco Use   Smoking status: Never   Smokeless tobacco: Never  Vaping Use   Vaping Use: Never used  Substance Use Topics   Alcohol use: No   Drug use: No    Family Medical History: Family History  Problem Relation Age of Onset   Hypertension Mother     Physical Examination: Telephone visit   Medical Decision Making  Imaging: MR Thoracic spine 10/03/21 IMPRESSION: 1. Subtle anterior displacement with flattening of the posterior margin of the mid thoracic cord at the levels of T6-7 through T8-9. Finding of uncertain etiology, but could be due to an underlying arachnoid web or cyst. No frank kinking to suggest spinal cord herniation. No associated cord signal changes. While this finding is of uncertain significance, this could be further assessed with dedicated CT myelogram as clinically warranted. 2. Small left paracentral disc protrusion at T4-5 without significant stenosis or impingement. 3. Additional mild noncompressive disc bulging elsewhere within the thoracic spine without significant stenosis or neural impingement.  Electronically Signed   By: Jeannine Boga M.D.   On: 10/04/2021 07:10  CT Thoracic spine myelogram 10/28/21 IMPRESSION: 1. 1.5 x 0.6 x 11 cm cystic mass opacifying with contrast along the dorsal aspect of the thoracic spinal cord at the level of T7-8 through T11-12 deforming the dorsal aspect of the spinal  cord particularly at T8-T10. Overall appearance is most consistent with an arachnoid cyst.     Electronically Signed   By: Kathreen Devoid M.D.   On: 10/28/2021 10:26  I have personally reviewed the images and agree with the above interpretation.  Assessment and Plan: Darrell [facility] is a pleasant 56 y.o. male with midthoracic pain with features of thoracic radiculopathy.  He has an intradural arachnoid cyst in the thoracic spine.  He has tried injections without improvement.  His thoracic stenosis deforming the spinal cord and could be causing his symptoms.  After reviewing his symptomatology, I feel that intervention is indicated.  No further conservative management is likely to improve his current condition.    I recommended T8-9 laminectomy for fenestration of arachnoid cyst.    I discussed the planned procedure at length with the patient, including the risks, benefits, alternatives, and indications. The risks discussed include but are not limited to bleeding, infection, need for reoperation, spinal fluid leak, stroke, vision loss, anesthetic complication, coma, paralysis, and even death. I also described in detail that improvement was not guaranteed.  The patient expressed understanding of these risks, and asked that we proceed with surgery. I described the surgery in layman's terms, and gave ample opportunity for questions, which were answered to the best of my ability.  I discussed the planned procedure at length with the patient, including the risks, benefits, alternatives, and indications. The risks discussed include but are not limited to bleeding, infection, need for reoperation, spinal fluid leak, stroke, vision loss, anesthetic complication, coma, paralysis, and even death. I also described in detail that improvement was not guaranteed.  The patient expressed understanding of these risks, and asked that we proceed with surgery. I described the surgery in layman's terms, and gave ample  opportunity for questions, which were answered to the best of my ability.  This visit was performed via telephone.  Patient location: work Provider location: office  I spent a total of 20 minutes non-face-to-face activities for this visit on the date of this encounter including review of current clinical condition and response to treatment.     Charlotte Brafford K. Izora Ribas MD, [facility] Neurosurgery

## 2022-02-15 ENCOUNTER — Telehealth: Payer: Self-pay

## 2022-02-15 NOTE — Telephone Encounter (Signed)
I spoke with Mr Trapani about the following:  Planned surgery: T8-9 laminectomy for cyst fenestration   Surgery date: 03/06/22 - you will find out your arrival time the business day before your surgery.   Pre-op appointment at [facility]: we will call you with a date/time for this. Pre-admit testing is located on the first floor of the [facility], [place], [address]. Please bring all prescriptions in the original prescription bottles to your appointment, even if you have reviewed medications by phone with a pharmacy representative. Pre-op labs may be done at your pre-op appointment. You are not required to fast for these labs. Should you need to change your pre-op appointment, please call Pre-admit testing at [phone].    If you have FMLA/disability paperwork, please drop it off or fax it to [phone], attention [place].   If you have any questions/concerns before or after surgery, you can reach [place] at [phone], or you can send a mychart message. If you have a concern after hours that cannot wait until normal business hours, you can call [phone] and ask to page the neurosurgeon on call for [place].   Appointments/FMLA & disability paperwork: [place] Nurse: Ophelia Shoulder  Medical assistant: Raquel Sarna Physician Assistant's: Cooper Render & Geronimo Boot Surgeon: Meade Maw, MD

## 2022-02-16 ENCOUNTER — Other Ambulatory Visit: Payer: Self-pay

## 2022-02-16 DIAGNOSIS — Z01818 Encounter for other preprocedural examination: Secondary | ICD-10-CM

## 2022-02-21 ENCOUNTER — Encounter
Admission: RE | Admit: 2022-02-21 | Discharge: 2022-02-21 | Disposition: A | Discharge status: Home / Self Care | Payer: 59 | Source: Ambulatory Visit | Attending: Neurosurgery | Admitting: Neurosurgery

## 2022-02-21 DIAGNOSIS — Z01812 Encounter for preprocedural laboratory examination: Secondary | ICD-10-CM | POA: Insufficient documentation

## 2022-02-21 DIAGNOSIS — E7849 Other hyperlipidemia: Secondary | ICD-10-CM | POA: Insufficient documentation

## 2022-02-21 HISTORY — DX: Hyperlipidemia, unspecified: E78.5

## 2022-02-21 HISTORY — DX: Anxiety disorder, unspecified: F41.9

## 2022-02-21 HISTORY — DX: Depression, unspecified: F32.A

## 2022-02-21 LAB — URINALYSIS, ROUTINE W REFLEX MICROSCOPIC
Bilirubin Urine: NEGATIVE
Glucose, UA: NEGATIVE mg/dL
Hgb urine dipstick: NEGATIVE
Ketones, ur: NEGATIVE mg/dL
Leukocytes,Ua: NEGATIVE
Nitrite: NEGATIVE
Protein, ur: NEGATIVE mg/dL
Specific Gravity, Urine: 1.014 (ref 1.005–1.030)
pH: 5 (ref 5.0–8.0)

## 2022-02-21 LAB — TYPE AND SCREEN
ABO/RH(D): A POS
Antibody Screen: NEGATIVE

## 2022-02-21 LAB — BASIC METABOLIC PANEL
Anion gap: 9 (ref 5–15)
BUN: 13 mg/dL (ref 6–20)
CO2: 27 mmol/L (ref 22–32)
Calcium: 9.4 mg/dL (ref 8.9–10.3)
Chloride: 102 mmol/L (ref 98–111)
Creatinine, Ser: 1.05 mg/dL (ref 0.61–1.24)
GFR, Estimated: >60 mL/min (ref >60)
Glucose, Bld: 95 mg/dL (ref 70–99)
Potassium: 3.9 mmol/L (ref 3.5–5.1)
Sodium: 138 mmol/L (ref 135–145)

## 2022-02-21 LAB — CBC
HCT: 43.1 % (ref 39.0–52.0)
Hemoglobin: 14.7 g/dL (ref 13.0–17.0)
MCH: 28.8 pg (ref 26.0–34.0)
MCHC: 34.1 g/dL (ref 30.0–36.0)
MCV: 84.3 fL (ref 80.0–100.0)
Platelets: 243 10*3/uL (ref 150–400)
RBC: 5.11 MIL/uL (ref 4.22–5.81)
RDW: 12.6 % (ref 11.5–15.5)
WBC: 10.1 10*3/uL (ref 4.0–10.5)
nRBC: 0 % (ref 0.0–0.2)

## 2022-02-21 LAB — SURGICAL PCR SCREEN
MRSA, PCR: NEGATIVE
Staphylococcus aureus: NEGATIVE

## 2022-02-21 NOTE — Patient Instructions (Signed)
Your procedure is scheduled on: Monday, November 27 Report to the Registration Desk on the 1st floor of the [facility]. To find out your arrival time, please call [phone] between 1PM - 3PM on: Friday, November 24 If your arrival time is 6:00 am, do not arrive prior to that time as the [facility] [place] entrance doors do not open until 6:00 am.  REMEMBER: Instructions that are not followed completely may result in serious medical risk, up to and including death; or upon the discretion of your surgeon and anesthesiologist your surgery may need to be rescheduled.  Do not eat food after midnight the night before surgery.  No gum chewing, lozengers or hard candies.  You may however, drink CLEAR liquids up to 2 hours before you are scheduled to arrive for your surgery. Do not drink anything within 2 hours of your scheduled arrival time.  Clear liquids include: - water  - apple juice without pulp - gatorade (not RED colors) - black coffee or tea (Do NOT add milk or creamers to the coffee or tea) Do NOT drink anything that is not on this list.  TAKE THESE MEDICATIONS THE MORNING OF SURGERY WITH A SIP OF WATER:  Tramadol if needed for pain  One week prior to surgery: starting November 20 Stop Anti-inflammatories (NSAIDS) such as Advil, Aleve, Ibuprofen, Motrin, Naproxen, Naprosyn and Aspirin based products such as Excedrin, Goodys Powder, BC Powder. Stop ANY OVER THE COUNTER supplements until after surgery. You may however, continue to take Tylenol if needed for pain up until the day of surgery.  No Alcohol for 24 hours before or after surgery.  No Smoking including e-cigarettes for 24 hours prior to surgery.  No chewable tobacco products for at least 6 hours prior to surgery.  No nicotine patches on the day of surgery.  Do not use any "recreational" drugs for at least a week prior to your surgery.  Please be advised that the combination of cocaine and anesthesia may have negative  outcomes, up to and including death. If you test positive for cocaine, your surgery will be cancelled.  On the morning of surgery brush your teeth with toothpaste and water, you may rinse your mouth with mouthwash if you wish. Do not swallow any toothpaste or mouthwash.  Use CHG Soap as directed on instruction sheet.  Do not wear jewelry.  Do not wear lotions, powders, or perfumes.   Do not shave body from the neck down 48 hours prior to surgery just in case you cut yourself which could leave a site for infection.  Also, freshly shaved skin may become irritated if using the CHG soap.  Contact lenses, hearing aids and dentures may not be worn into surgery.  Do not bring valuables to the hospital. [facility] is not responsible for any missing/lost belongings or valuables.   Notify your doctor if there is any change in your medical condition (cold, fever, infection).  Wear comfortable clothing (specific to your surgery type) to the hospital.  After surgery, you can help prevent lung complications by doing breathing exercises.  Take deep breaths and cough every 1-2 hours. Your doctor may order a device called an Incentive Spirometer to help you take deep breaths.  If you are being admitted to the hospital overnight, leave your suitcase in the car. After surgery it may be brought to your room.  If you are being discharged the day of surgery, you will not be allowed to drive home. You will need a  responsible adult (18 years or older) to drive you home and stay with you that night.   If you are taking public transportation, you will need to have a responsible adult (18 years or older) with you. Please confirm with your physician that it is acceptable to use public transportation.   Please call the [facility] Dept. at [phone] if you have any questions about these instructions.  Surgery Visitation Policy:  Patients undergoing a surgery or procedure may have two  family members or support persons with them as long as the person is not COVID-19 positive or experiencing its symptoms.   Inpatient Visitation:    Visiting hours are 7 a.m. to 8 p.m. Up to four visitors are allowed at one time in a patient room. The visitors may rotate out with other people during the day. One designated support person (adult) may remain overnight.  MASKING: Due to an increase in RSV rates and hospitalizations, starting Wednesday, Nov. 15, in patient care areas in which we serve newborns, infants and children, masks will be required for teammates and visitors.  Children ages 2 and under may not visit. This policy affects the following departments only:  [facility] Postpartum area Mother Baby Unit Newborn nursery/Special care nursery  Other areas: Masks continue to be strongly recommended for [facility] teammates, visitors and patients in all other areas. Visitation is not restricted outside of the units listed above.     Preparing for Surgery with CHLORHEXIDINE GLUCONATE (CHG) Soap  Chlorhexidine Gluconate (CHG) Soap  o An antiseptic cleaner that kills germs and bonds with the skin to continue killing germs even after washing  o Used for showering the night before surgery and morning of surgery  Before surgery, you can play an important role by reducing the number of germs on your skin.  CHG (Chlorhexidine gluconate) soap is an antiseptic cleanser which kills germs and bonds with the skin to continue killing germs even after washing.  Please do not use if you have an allergy to CHG or antibacterial soaps. If your skin becomes reddened/irritated stop using the CHG.  1. Shower the NIGHT BEFORE SURGERY and the MORNING OF SURGERY with CHG soap.  2. If you choose to wash your hair, wash your hair first as usual with your normal shampoo.  3. After shampooing, rinse your hair and body thoroughly to remove the shampoo.  4. Use CHG as you  would any other liquid soap. You can apply CHG directly to the skin and wash gently with a scrungie or a clean washcloth.  5. Apply the CHG soap to your body only from the neck down. Do not use on open wounds or open sores. Avoid contact with your eyes, ears, mouth, and genitals (private parts). Wash face and genitals (private parts) with your normal soap.  6. Wash thoroughly, paying special attention to the area where your surgery will be performed.  7. Thoroughly rinse your body with warm water.  8. Do not shower/wash with your normal soap after using and rinsing off the CHG soap.  9. Pat yourself dry with a clean towel.  10. Wear clean pajamas to bed the night before surgery.  12. Place clean sheets on your bed the night of your first shower and do not sleep with pets.  13. Shower again with the CHG soap on the day of surgery prior to arriving at the hospital.  14. Do not apply any deodorants/lotions/powders.  15. Please wear clean clothes to  the hospital.

## 2022-02-28 DIAGNOSIS — F3340 Major depressive disorder, recurrent, in remission, unspecified: Secondary | ICD-10-CM | POA: Diagnosis not present

## 2022-02-28 DIAGNOSIS — F419 Anxiety disorder, unspecified: Secondary | ICD-10-CM | POA: Diagnosis not present

## 2022-03-05 MED ORDER — LACTATED RINGERS IV SOLN
INTRAVENOUS | Status: DC
Start: 2022-03-05 — End: 2022-03-06

## 2022-03-05 MED ORDER — CEFAZOLIN IN SODIUM CHLORIDE 2-0.9 GM/100ML-% IV SOLN
2.0000 g | Freq: Once | INTRAVENOUS | Status: DC
  Filled 2022-03-05: qty 100

## 2022-03-05 MED ORDER — ORAL CARE MOUTH RINSE: 15.0000 mL | Freq: Once | OROMUCOSAL | Status: AC

## 2022-03-05 MED ORDER — CHLORHEXIDINE GLUCONATE 0.12 % MT SOLN: 15.0000 mL | Freq: Once | OROMUCOSAL | Status: AC

## 2022-03-05 MED ORDER — FAMOTIDINE 20 MG PO TABS: 20.0000 mg | ORAL_TABLET | Freq: Once | ORAL | Status: AC

## 2022-03-06 ENCOUNTER — Inpatient Hospital Stay: Payer: 59 | Admitting: Urgent Care

## 2022-03-06 ENCOUNTER — Encounter
Admission: RE | Disposition: A | Discharge status: Home / Self Care | Payer: Self-pay | Source: Home / Self Care | Attending: Neurosurgery

## 2022-03-06 ENCOUNTER — Other Ambulatory Visit: Payer: Self-pay

## 2022-03-06 ENCOUNTER — Inpatient Hospital Stay: Payer: 59

## 2022-03-06 ENCOUNTER — Encounter: Payer: Self-pay | Admitting: Neurosurgery

## 2022-03-06 ENCOUNTER — Inpatient Hospital Stay: Payer: 59 | Admitting: Certified Registered"

## 2022-03-06 ENCOUNTER — Inpatient Hospital Stay
Admission: RE | Admit: 2022-03-06 | Discharge: 2022-03-07 | DRG: 030 | Disposition: A | Discharge status: Home / Self Care | Payer: 59 | Attending: Neurosurgery | Admitting: Neurosurgery

## 2022-03-06 DIAGNOSIS — M5114 Intervertebral disc disorders with radiculopathy, thoracic region: Secondary | ICD-10-CM | POA: Diagnosis not present

## 2022-03-06 DIAGNOSIS — F419 Anxiety disorder, unspecified: Secondary | ICD-10-CM | POA: Diagnosis not present

## 2022-03-06 DIAGNOSIS — Z91012 Allergy to eggs: Secondary | ICD-10-CM | POA: Diagnosis not present

## 2022-03-06 DIAGNOSIS — G930 Cerebral cysts: Secondary | ICD-10-CM | POA: Diagnosis not present

## 2022-03-06 DIAGNOSIS — E7849 Other hyperlipidemia: Secondary | ICD-10-CM

## 2022-03-06 DIAGNOSIS — Z882 Allergy status to sulfonamides status: Secondary | ICD-10-CM | POA: Diagnosis not present

## 2022-03-06 DIAGNOSIS — Z87442 Personal history of urinary calculi: Secondary | ICD-10-CM | POA: Diagnosis not present

## 2022-03-06 DIAGNOSIS — M5414 Radiculopathy, thoracic region: Secondary | ICD-10-CM | POA: Diagnosis not present

## 2022-03-06 DIAGNOSIS — Z9109 Other allergy status, other than to drugs and biological substances: Secondary | ICD-10-CM | POA: Diagnosis not present

## 2022-03-06 DIAGNOSIS — G96198 Other disorders of meninges, not elsewhere classified: Secondary | ICD-10-CM | POA: Diagnosis present

## 2022-03-06 DIAGNOSIS — M4804 Spinal stenosis, thoracic region: Secondary | ICD-10-CM | POA: Diagnosis not present

## 2022-03-06 DIAGNOSIS — Z880 Allergy status to penicillin: Secondary | ICD-10-CM | POA: Diagnosis not present

## 2022-03-06 DIAGNOSIS — Z01812 Encounter for preprocedural laboratory examination: Principal | ICD-10-CM

## 2022-03-06 DIAGNOSIS — Z974 Presence of external hearing-aid: Secondary | ICD-10-CM

## 2022-03-06 DIAGNOSIS — Z01818 Encounter for other preprocedural examination: Secondary | ICD-10-CM

## 2022-03-06 DIAGNOSIS — E785 Hyperlipidemia, unspecified: Secondary | ICD-10-CM | POA: Diagnosis not present

## 2022-03-06 DIAGNOSIS — G96191 Perineural cyst: Secondary | ICD-10-CM | POA: Diagnosis present

## 2022-03-06 DIAGNOSIS — Z8249 Family history of ischemic heart disease and other diseases of the circulatory system: Secondary | ICD-10-CM | POA: Diagnosis not present

## 2022-03-06 HISTORY — PX: LAMINECTOMY: SHX219

## 2022-03-06 LAB — ABO/RH: ABO/RH(D): A POS

## 2022-03-06 SURGERY — THORACIC LAMINECTOMY FOR TUMOR
Anesthesia: General | Site: Spine Thoracic

## 2022-03-06 MED ORDER — BISACODYL 10 MG RE SUPP: 10.0000 mg | Freq: Every day | RECTAL | Status: DC | PRN

## 2022-03-06 MED ORDER — SERTRALINE HCL 50 MG PO TABS
200.0000 mg | ORAL_TABLET | Freq: Every day | ORAL | Status: DC
  Administered 2022-03-06: 200 mg via ORAL
  Filled 2022-03-06 (×2): qty 4

## 2022-03-06 MED ORDER — DEXAMETHASONE SODIUM PHOSPHATE 10 MG/ML IJ SOLN
INTRAMUSCULAR | Status: AC
  Filled 2022-03-06: qty 1

## 2022-03-06 MED ORDER — FAMOTIDINE 20 MG PO TABS
ORAL_TABLET | ORAL | Status: AC
  Administered 2022-03-06: 20 mg via ORAL
  Filled 2022-03-06: qty 1

## 2022-03-06 MED ORDER — PROPOFOL 1000 MG/100ML IV EMUL
INTRAVENOUS | Status: AC
  Filled 2022-03-06: qty 100

## 2022-03-06 MED ORDER — CEFAZOLIN SODIUM-DEXTROSE 2-4 GM/100ML-% IV SOLN
INTRAVENOUS | Status: AC
  Filled 2022-03-06: qty 100

## 2022-03-06 MED ORDER — SENNOSIDES-DOCUSATE SODIUM 8.6-50 MG PO TABS: 1.0000 | ORAL_TABLET | Freq: Every evening | ORAL | Status: DC | PRN

## 2022-03-06 MED ORDER — FIBRIN SEALANT 2 ML SINGLE DOSE KIT
PACK | CUTANEOUS | Status: DC | PRN
  Administered 2022-03-06: 2 mL via TOPICAL

## 2022-03-06 MED ORDER — PROPOFOL 500 MG/50ML IV EMUL
INTRAVENOUS | Status: DC | PRN
  Administered 2022-03-06: 150 ug/kg/min via INTRAVENOUS

## 2022-03-06 MED ORDER — LACTATED RINGERS IV SOLN: INTRAVENOUS | Status: DC | PRN

## 2022-03-06 MED ORDER — EPINEPHRINE 0.3 MG/0.3ML IJ SOAJ: 0.3000 mg | INTRAMUSCULAR | Status: DC | PRN

## 2022-03-06 MED ORDER — MIDAZOLAM HCL 2 MG/2ML IJ SOLN
INTRAMUSCULAR | Status: AC
  Filled 2022-03-06: qty 2

## 2022-03-06 MED ORDER — FENTANYL CITRATE (PF) 100 MCG/2ML IJ SOLN
INTRAMUSCULAR | Status: AC
  Filled 2022-03-06: qty 2

## 2022-03-06 MED ORDER — CHLORHEXIDINE GLUCONATE 0.12 % MT SOLN
OROMUCOSAL | Status: AC
  Administered 2022-03-06: 15 mL via OROMUCOSAL
  Filled 2022-03-06: qty 15

## 2022-03-06 MED ORDER — KETAMINE HCL 10 MG/ML IJ SOLN
INTRAMUSCULAR | Status: DC | PRN
  Administered 2022-03-06: 20 mg via INTRAVENOUS

## 2022-03-06 MED ORDER — ONDANSETRON HCL 4 MG/2ML IJ SOLN: 4.0000 mg | Freq: Four times a day (QID) | INTRAMUSCULAR | Status: DC | PRN

## 2022-03-06 MED ORDER — ONDANSETRON HCL 4 MG/2ML IJ SOLN: 4.0000 mg | Freq: Once | INTRAMUSCULAR | Status: DC | PRN

## 2022-03-06 MED ORDER — MENTHOL 3 MG MT LOZG: 1.0000 | LOZENGE | OROMUCOSAL | Status: DC | PRN

## 2022-03-06 MED ORDER — ONDANSETRON HCL 4 MG/2ML IJ SOLN
INTRAMUSCULAR | Status: AC
  Filled 2022-03-06: qty 2

## 2022-03-06 MED ORDER — OXYCODONE HCL 5 MG PO TABS
5.0000 mg | ORAL_TABLET | ORAL | Status: DC | PRN
  Administered 2022-03-06: 5 mg via ORAL
  Filled 2022-03-06: qty 1

## 2022-03-06 MED ORDER — MORPHINE SULFATE (PF) 2 MG/ML IV SOLN: 2.0000 mg | INTRAVENOUS | Status: AC | PRN

## 2022-03-06 MED ORDER — REMIFENTANIL HCL 1 MG IV SOLR
INTRAVENOUS | Status: AC
  Filled 2022-03-06: qty 1000

## 2022-03-06 MED ORDER — SENNA 8.6 MG PO TABS
1.0000 | ORAL_TABLET | Freq: Two times a day (BID) | ORAL | Status: DC
  Administered 2022-03-06 – 2022-03-07 (×2): 8.6 mg via ORAL
  Filled 2022-03-06 (×2): qty 1

## 2022-03-06 MED ORDER — BUPIVACAINE-EPINEPHRINE (PF) 0.5% -1:200000 IJ SOLN
INTRAMUSCULAR | Status: DC | PRN
  Administered 2022-03-06: 3 mL

## 2022-03-06 MED ORDER — DOCUSATE SODIUM 100 MG PO CAPS
100.0000 mg | ORAL_CAPSULE | Freq: Two times a day (BID) | ORAL | Status: DC
  Administered 2022-03-06 – 2022-03-07 (×2): 100 mg via ORAL
  Filled 2022-03-06 (×2): qty 1

## 2022-03-06 MED ORDER — SODIUM CHLORIDE (PF) 0.9 % IJ SOLN
INTRAMUSCULAR | Status: DC | PRN
  Administered 2022-03-06: 60 mL via INTRAMUSCULAR

## 2022-03-06 MED ORDER — ENOXAPARIN SODIUM 40 MG/0.4ML IJ SOSY
40.0000 mg | PREFILLED_SYRINGE | INTRAMUSCULAR | Status: DC
  Administered 2022-03-07: 40 mg via SUBCUTANEOUS
  Filled 2022-03-06: qty 0.4

## 2022-03-06 MED ORDER — ACETAMINOPHEN 10 MG/ML IV SOLN: 1000.0000 mg | Freq: Once | INTRAVENOUS | Status: DC | PRN

## 2022-03-06 MED ORDER — FENTANYL CITRATE (PF) 100 MCG/2ML IJ SOLN
INTRAMUSCULAR | Status: DC | PRN
  Administered 2022-03-06: 100 ug via INTRAVENOUS

## 2022-03-06 MED ORDER — PHENYLEPHRINE HCL-NACL 20-0.9 MG/250ML-% IV SOLN
INTRAVENOUS | Status: DC | PRN
  Administered 2022-03-06: 50 ug/min via INTRAVENOUS

## 2022-03-06 MED ORDER — ONDANSETRON HCL 4 MG/2ML IJ SOLN
INTRAMUSCULAR | Status: DC | PRN
  Administered 2022-03-06: 4 mg via INTRAVENOUS

## 2022-03-06 MED ORDER — VANCOMYCIN HCL 1750 MG/350ML IV SOLN
1750.0000 mg | INTRAVENOUS | Status: DC
  Filled 2022-03-06: qty 350

## 2022-03-06 MED ORDER — ACETAMINOPHEN 500 MG PO TABS
1000.0000 mg | ORAL_TABLET | Freq: Four times a day (QID) | ORAL | Status: DC
  Administered 2022-03-06 – 2022-03-07 (×2): 1000 mg via ORAL
  Filled 2022-03-06 (×3): qty 2

## 2022-03-06 MED ORDER — REMIFENTANIL HCL 1 MG IV SOLR
INTRAVENOUS | Status: DC | PRN
  Administered 2022-03-06: .2 ug/kg/min via INTRAVENOUS

## 2022-03-06 MED ORDER — PROPOFOL 10 MG/ML IV BOLUS
INTRAVENOUS | Status: AC
  Filled 2022-03-06: qty 20

## 2022-03-06 MED ORDER — MIDAZOLAM HCL 2 MG/2ML IJ SOLN
INTRAMUSCULAR | Status: DC | PRN
  Administered 2022-03-06: 2 mg via INTRAVENOUS

## 2022-03-06 MED ORDER — 0.9 % SODIUM CHLORIDE (POUR BTL) OPTIME
TOPICAL | Status: DC | PRN
  Administered 2022-03-06 (×2): 500 mL

## 2022-03-06 MED ORDER — SODIUM CHLORIDE 0.9 % IV SOLN: 250.0000 mL | INTRAVENOUS | Status: DC

## 2022-03-06 MED ORDER — LIDOCAINE HCL (CARDIAC) PF 100 MG/5ML IV SOSY
PREFILLED_SYRINGE | INTRAVENOUS | Status: DC | PRN
  Administered 2022-03-06: 80 mg via INTRAVENOUS

## 2022-03-06 MED ORDER — CEFAZOLIN SODIUM-DEXTROSE 2-4 GM/100ML-% IV SOLN: 2.0000 g | INTRAVENOUS | Status: DC

## 2022-03-06 MED ORDER — HYDROMORPHONE HCL 1 MG/ML IJ SOLN
INTRAMUSCULAR | Status: DC | PRN
  Administered 2022-03-06 (×2): .25 mg via INTRAVENOUS
  Administered 2022-03-06: .5 mg via INTRAVENOUS

## 2022-03-06 MED ORDER — SODIUM CHLORIDE 0.9% FLUSH: 3.0000 mL | INTRAVENOUS | Status: DC | PRN

## 2022-03-06 MED ORDER — BUPIVACAINE HCL (PF) 0.5 % IJ SOLN
INTRAMUSCULAR | Status: AC
  Filled 2022-03-06: qty 30

## 2022-03-06 MED ORDER — BUPIVACAINE-EPINEPHRINE (PF) 0.5% -1:200000 IJ SOLN
INTRAMUSCULAR | Status: AC
  Filled 2022-03-06: qty 30

## 2022-03-06 MED ORDER — DEXAMETHASONE SODIUM PHOSPHATE 10 MG/ML IJ SOLN
INTRAMUSCULAR | Status: DC | PRN
  Administered 2022-03-06: 10 mg via INTRAVENOUS

## 2022-03-06 MED ORDER — ACETAMINOPHEN 10 MG/ML IV SOLN
INTRAVENOUS | Status: AC
  Filled 2022-03-06: qty 100

## 2022-03-06 MED ORDER — ONDANSETRON HCL 4 MG PO TABS: 4.0000 mg | ORAL_TABLET | Freq: Four times a day (QID) | ORAL | Status: DC | PRN

## 2022-03-06 MED ORDER — KETOROLAC TROMETHAMINE 15 MG/ML IJ SOLN
15.0000 mg | Freq: Four times a day (QID) | INTRAMUSCULAR | Status: DC
  Administered 2022-03-06 – 2022-03-07 (×4): 15 mg via INTRAVENOUS
  Filled 2022-03-06 (×4): qty 1

## 2022-03-06 MED ORDER — SUCCINYLCHOLINE CHLORIDE 200 MG/10ML IV SOSY
PREFILLED_SYRINGE | INTRAVENOUS | Status: DC | PRN
  Administered 2022-03-06: 100 mg via INTRAVENOUS

## 2022-03-06 MED ORDER — OXYCODONE HCL 5 MG/5ML PO SOLN: 5.0000 mg | Freq: Once | ORAL | Status: AC | PRN

## 2022-03-06 MED ORDER — SODIUM CHLORIDE FLUSH 0.9 % IV SOLN
INTRAVENOUS | Status: AC
  Filled 2022-03-06: qty 20

## 2022-03-06 MED ORDER — ACETAMINOPHEN 10 MG/ML IV SOLN
INTRAVENOUS | Status: DC | PRN
  Administered 2022-03-06: 1000 mg via INTRAVENOUS

## 2022-03-06 MED ORDER — FENTANYL CITRATE (PF) 100 MCG/2ML IJ SOLN: 25.0000 ug | INTRAMUSCULAR | Status: DC | PRN

## 2022-03-06 MED ORDER — SODIUM CHLORIDE 0.9 % IV SOLN: INTRAVENOUS | Status: DC

## 2022-03-06 MED ORDER — LIDOCAINE HCL (PF) 2 % IJ SOLN
INTRAMUSCULAR | Status: AC
  Filled 2022-03-06: qty 5

## 2022-03-06 MED ORDER — OXYCODONE HCL 5 MG PO TABS
ORAL_TABLET | ORAL | Status: AC
  Administered 2022-03-06: 5 mg via ORAL
  Filled 2022-03-06: qty 1

## 2022-03-06 MED ORDER — PHENOL 1.4 % MT LIQD: 1.0000 | OROMUCOSAL | Status: DC | PRN

## 2022-03-06 MED ORDER — OXYCODONE HCL 5 MG PO TABS: 5.0000 mg | ORAL_TABLET | Freq: Once | ORAL | Status: AC | PRN

## 2022-03-06 MED ORDER — HYDROMORPHONE HCL 1 MG/ML IJ SOLN
INTRAMUSCULAR | Status: AC
  Filled 2022-03-06: qty 1

## 2022-03-06 MED ORDER — SODIUM CHLORIDE 0.9% FLUSH
3.0000 mL | Freq: Two times a day (BID) | INTRAVENOUS | Status: DC
  Administered 2022-03-06 – 2022-03-07 (×2): 3 mL via INTRAVENOUS

## 2022-03-06 MED ORDER — SURGIFLO WITH THROMBIN (HEMOSTATIC MATRIX KIT) OPTIME
TOPICAL | Status: DC | PRN
  Administered 2022-03-06: 1 via TOPICAL

## 2022-03-06 MED ORDER — SUCCINYLCHOLINE CHLORIDE 200 MG/10ML IV SOSY
PREFILLED_SYRINGE | INTRAVENOUS | Status: AC
  Filled 2022-03-06: qty 10

## 2022-03-06 MED ORDER — FLEET ENEMA 7-19 GM/118ML RE ENEM: 1.0000 | ENEMA | Freq: Once | RECTAL | Status: DC | PRN

## 2022-03-06 MED ORDER — CEFAZOLIN SODIUM-DEXTROSE 2-4 GM/100ML-% IV SOLN
2.0000 g | Freq: Once | INTRAVENOUS | Status: AC
  Administered 2022-03-06: 2 g via INTRAVENOUS

## 2022-03-06 MED ORDER — METHOCARBAMOL 500 MG PO TABS
500.0000 mg | ORAL_TABLET | Freq: Four times a day (QID) | ORAL | Status: DC | PRN
  Administered 2022-03-07: 500 mg via ORAL
  Filled 2022-03-06 (×2): qty 1

## 2022-03-06 MED ORDER — PROPOFOL 10 MG/ML IV BOLUS
INTRAVENOUS | Status: DC | PRN
  Administered 2022-03-06: 200 mg via INTRAVENOUS

## 2022-03-06 MED ORDER — METHOCARBAMOL 1000 MG/10ML IJ SOLN: 500.0000 mg | Freq: Four times a day (QID) | INTRAVENOUS | Status: DC | PRN

## 2022-03-06 MED ORDER — ESCITALOPRAM OXALATE 10 MG PO TABS
5.0000 mg | ORAL_TABLET | Freq: Every day | ORAL | Status: DC
  Administered 2022-03-06: 5 mg via ORAL
  Filled 2022-03-06 (×2): qty 1

## 2022-03-06 MED ORDER — IRRISEPT - 450ML BOTTLE WITH 0.05% CHG IN STERILE WATER, USP 99.95% OPTIME
TOPICAL | Status: DC | PRN
  Administered 2022-03-06: 100 mL

## 2022-03-06 MED ORDER — OXYCODONE HCL 5 MG PO TABS
10.0000 mg | ORAL_TABLET | ORAL | Status: DC | PRN
  Administered 2022-03-07 (×3): 10 mg via ORAL
  Filled 2022-03-06 (×3): qty 2

## 2022-03-06 MED ORDER — KETAMINE HCL 50 MG/5ML IJ SOSY
PREFILLED_SYRINGE | INTRAMUSCULAR | Status: AC
  Filled 2022-03-06: qty 5

## 2022-03-06 MED ORDER — BUPIVACAINE LIPOSOME 1.3 % IJ SUSP
INTRAMUSCULAR | Status: AC
  Filled 2022-03-06: qty 20

## 2022-03-06 SURGICAL SUPPLY — 60 items
BUR NEURO DRILL SOFT 3.0X3.8M (BURR) ×1 IMPLANT
CHLORAPREP W/TINT 26 (MISCELLANEOUS) ×2 IMPLANT
COUNTER NEEDLE 20/40 LG (NEEDLE) ×1 IMPLANT
CUP MEDICINE 2OZ PLAST GRAD ST (MISCELLANEOUS) ×1 IMPLANT
DERMABOND ADVANCED .7 DNX12 (GAUZE/BANDAGES/DRESSINGS) ×2 IMPLANT
DRAPE C ARM PK CFD 31 SPINE (DRAPES) ×1 IMPLANT
DRAPE LAPAROTOMY 100X77 ABD (DRAPES) ×1 IMPLANT
DRAPE SURG 17X11 SM STRL (DRAPES) ×1 IMPLANT
DRSG OPSITE POSTOP 4X6 (GAUZE/BANDAGES/DRESSINGS) ×1 IMPLANT
DRSG OPSITE POSTOP 4X8 (GAUZE/BANDAGES/DRESSINGS) ×1 IMPLANT
ELECT CAUTERY BLADE TIP 2.5 (TIP) ×1
ELECT REM PT RETURN 9FT ADLT (ELECTROSURGICAL) ×1
ELECTRODE CAUTERY BLDE TIP 2.5 (TIP) ×1 IMPLANT
ELECTRODE REM PT RTRN 9FT ADLT (ELECTROSURGICAL) ×1 IMPLANT
FEE INTRAOP CADWELL SUPPLY NCS (MISCELLANEOUS) IMPLANT
FEE INTRAOP MONITOR IMPULS NCS (MISCELLANEOUS) IMPLANT
GAUZE 4X4 16PLY ~~LOC~~+RFID DBL (SPONGE) ×1 IMPLANT
GAUZE XEROFORM 1X8 LF (GAUZE/BANDAGES/DRESSINGS) IMPLANT
GLOVE BIOGEL PI IND STRL 6.5 (GLOVE) ×1 IMPLANT
GLOVE SURG SYN 6.5 ES PF (GLOVE) ×1 IMPLANT
GLOVE SURG SYN 6.5 PF PI (GLOVE) ×1 IMPLANT
GLOVE SURG SYN 8.5  E (GLOVE) ×3
GLOVE SURG SYN 8.5 E (GLOVE) ×3 IMPLANT
GLOVE SURG SYN 8.5 PF PI (GLOVE) ×3 IMPLANT
GOWN SRG LRG LVL 4 IMPRV REINF (GOWNS) ×1 IMPLANT
GOWN SRG XL LVL 3 NONREINFORCE (GOWNS) ×1 IMPLANT
GOWN STRL NON-REIN TWL XL LVL3 (GOWNS) ×1
GOWN STRL REIN LRG LVL4 (GOWNS) ×1
GRADUATE 1200CC STRL 31836 (MISCELLANEOUS) ×1 IMPLANT
GRAFT DURAGEN MATRIX 1WX1L (Tissue) IMPLANT
INTRAOP CADWELL SUPPLY FEE NCS (MISCELLANEOUS) ×2
INTRAOP DISP SUPPLY FEE NCS (MISCELLANEOUS) ×2
INTRAOP MONITOR FEE IMPULS NCS (MISCELLANEOUS) ×2
INTRAOP MONITOR FEE IMPULSE (MISCELLANEOUS) ×2
JET LAVAGE IRRISEPT WOUND (IRRIGATION / IRRIGATOR) ×1
KIT SPINAL PRONEVIEW (KITS) ×1 IMPLANT
KIT TURNOVER KIT A (KITS) ×1 IMPLANT
LAVAGE JET IRRISEPT WOUND (IRRIGATION / IRRIGATOR) IMPLANT
MANIFOLD NEPTUNE II (INSTRUMENTS) ×1 IMPLANT
MARKER SKIN DUAL TIP RULER LAB (MISCELLANEOUS) ×2 IMPLANT
NDL SAFETY ECLIP 18X1.5 (MISCELLANEOUS) ×1 IMPLANT
NS IRRIG 1000ML POUR BTL (IV SOLUTION) ×1 IMPLANT
PACK LAMINECTOMY NEURO (CUSTOM PROCEDURE TRAY) ×1 IMPLANT
SOLUTION IRRIG SURGIPHOR (IV SOLUTION) ×1 IMPLANT
STAPLER SKIN PROX 35W (STAPLE) ×1 IMPLANT
SURGIFLO W/THROMBIN 8M KIT (HEMOSTASIS) ×1 IMPLANT
SUT DVC VLOC 3-0 CL 6 P-12 (SUTURE) ×1 IMPLANT
SUT ETHILON 3-0 FS-10 30 BLK (SUTURE) ×1
SUT GTX CV-5 TTC13 DBL (SUTURE) IMPLANT
SUT NURALON 4 0 TR CR/8 (SUTURE) IMPLANT
SUT SILK 2 0SH CR/8 30 (SUTURE) ×1 IMPLANT
SUT VIC AB 0 CT1 18XCR BRD 8 (SUTURE) ×1 IMPLANT
SUT VIC AB 0 CT1 8-18 (SUTURE) ×4
SUT VIC AB 2-0 CT1 18 (SUTURE) ×1 IMPLANT
SUTURE EHLN 3-0 FS-10 30 BLK (SUTURE) IMPLANT
SYR 10ML LL (SYRINGE) ×1 IMPLANT
SYR 30ML LL (SYRINGE) ×2 IMPLANT
TOWEL OR 17X26 4PK STRL BLUE (TOWEL DISPOSABLE) ×3 IMPLANT
TRAP FLUID SMOKE EVACUATOR (MISCELLANEOUS) ×1 IMPLANT
TUBING CONNECTING 10 (TUBING) ×1 IMPLANT

## 2022-03-06 NOTE — Op Note (Signed)
Indications: [name] is a 55 year old gentleman who presented withM54.14 Thoracic radiculopathy, G93.0 Arachnoid cyst.  He tried and failed conservative management prompting surgical intervention.  Findings: Intradural arachnoid cyst  Preoperative Diagnosis: M54.14 Thoracic radiculopathy, G93.0 Arachnoid cyst  Postoperative Diagnosis: same   EBL: 100 ml IVF: see AR ml Drains: none Disposition: Extubated and Stable to PACU Complications: none  No foley catheter was placed.   Preoperative Note:   Risks of surgery discussed include: infection, bleeding, stroke, coma, death, paralysis, CSF leak, nerve/spinal cord injury, numbness, tingling, weakness, complex regional pain syndrome, recurrent stenosis and/or disc herniation, vascular injury, development of instability, neck/back pain, need for further surgery, persistent symptoms, development of deformity, and the risks of anesthesia. The patient understood these risks and agreed to proceed.  Operative Note:  1.  Thoracic laminectomy at T8-9 for fenestration of intradural arachnoid cyst   The patient was brought to the Operating Room, intubated and turned into the prone position. All pressure points were checked and double checked. Flouroscopy was used to mark the incision. The patient was prepped and draped in the standard fashion. A full timeout was performed. Preoperative antibiotics were given. The incision was injected with local anesthetic.  The incision was opened with a scalpel, then the soft tissues divided with the Bovie. Self-retaining retractors were placed. The paraspinus muscles were reflected laterally in subperiosteal fashion until the transverse processes were visible. Flouroscopy was used to confirm our localization.  We then utilized the high-speed drill to perform trough laminectomies at T8 and T9.  The lamina and spinous process of T8 and T9 were then handed off.  Epidural hemostasis was achieved.   At this point,  the dura was opened sharply with a #15 blade knife.  The dura was tacked back with 4-0 Nurolon sutures.  An intradural arachnoid cyst was identified dorsal to the spinal cord.  After inspecting this, microscissors were used to sharply fenestrate the cyst.  A portion of the cyst wall was sent for pathology.  After wide fenestration of the arachnoid cyst, the intradural compartment was carefully irrigated.  No bleeding was noted.  Using a 5-0 Gore-Tex, the dura was closed in running fashion.  A Valsalva was performed with no evidence of spinal fluid leakage.  The dural closure was augmented with DuraGen and Tisseel.  We then turned our attention to hemostasis. After hemostasis, the wound was closed in layers with 0 and 2-0 vicryl. 3-0 nylon was used to close the skin.    The patient was then flipped supine and extubated with incident. All counts were correct times 2 at the end of the case. No immediate complications were noted.  Cooper Render PA assisted in the entire procedure. An assistant was required for this procedure due to the complexity.  The assistant provided assistance in tissue manipulation and suction, and was required for the successful and safe performance of the procedure. I performed the critical portions of the procedure.  Monitoring was utilized throughout without any changes.  Meade Maw MD

## 2022-03-06 NOTE — Interval H&P Note (Signed)
History and Physical Interval Note:  03/06/2022 12:49 PM  Darrell Kelley  has presented today for surgery, with the diagnosis of M54.14 Thoracic radiculopathy G93.0 Arachnoid cyst.  The various methods of treatment have been discussed with the patient and family. After consideration of risks, benefits and other options for treatment, the patient has consented to  Procedure(s): OPEN T8-9 LAMINECTOMY FOR CYST FENESTRATION (N/A) as a surgical intervention.  The patient's history has been reviewed, patient examined, no change in status, stable for surgery.  I have reviewed the patient's chart and labs.  Questions were answered to the patient's satisfaction.    Heart sounds normal no MRG. Chest Clear to Auscultation Bilaterally. CNI MAEW 5/5 BLE   Talbot Monarch

## 2022-03-06 NOTE — Anesthesia Preprocedure Evaluation (Signed)
Anesthesia Evaluation  Patient identified by MRN, date of birth, ID band Patient awake    Reviewed: Allergy & Precautions, NPO status , Patient's Chart, lab work & pertinent test results  History of Anesthesia Complications Negative for: history of anesthetic complications  Airway Mallampati: II  TM Distance: >3 FB Neck ROM: Full    Dental no notable dental hx. (+) Teeth Intact   Pulmonary neg pulmonary ROS, neg sleep apnea, neg COPD, Patient abstained from smoking.Not current smoker   Pulmonary exam normal breath sounds clear to auscultation       Cardiovascular Exercise Tolerance: Good METS(-) hypertension(-) CAD and (-) Past MI negative cardio ROS (-) dysrhythmias  Rhythm:Regular Rate:Normal - Systolic murmurs    Neuro/Psych  PSYCHIATRIC DISORDERS Anxiety Depression    negative neurological ROS     GI/Hepatic ,neg GERD  ,,(+)     (-) substance abuse    Endo/Other  neg diabetes    Renal/GU negative Renal ROS     Musculoskeletal   Abdominal   Peds  Hematology   Anesthesia Other Findings Past Medical History: No date: Anxiety No date: Depression No date: History of kidney stones No date: Hyperlipidemia No date: Wears hearing aid in both ears  Reproductive/Obstetrics                              Anesthesia Physical Anesthesia Plan  ASA: 2  Anesthesia Plan: General   Post-op Pain Management: Ofirmev IV (intra-op)*   Induction: Intravenous  PONV Risk Score and Plan: 3 and Ondansetron, Dexamethasone and Midazolam  Airway Management Planned: Oral ETT  Additional Equipment: None  Intra-op Plan:   Post-operative Plan: Extubation in OR  Informed Consent: I have reviewed the patients History and Physical, chart, labs and discussed the procedure including the risks, benefits and alternatives for the proposed anesthesia with the patient or authorized representative who has  indicated his/her understanding and acceptance.     Dental advisory given  Plan Discussed with: CRNA and Surgeon  Anesthesia Plan Comments: (Discussed risks of anesthesia with patient, including PONV, sore throat, lip/dental/eye damage. Rare risks discussed as well, such as cardiorespiratory and neurological sequelae, and allergic reactions. Discussed the role of CRNA in patient's perioperative care. Patient understands.)         Anesthesia Quick Evaluation

## 2022-03-06 NOTE — Anesthesia Procedure Notes (Signed)
Procedure Name: Intubation Date/Time: 03/06/2022 1:32 PM  Performed by: Natasha Mead, CRNAPre-anesthesia Checklist: Patient identified, Emergency Drugs available, Suction available and Patient being monitored Patient Re-evaluated:Patient Re-evaluated prior to induction Oxygen Delivery Method: Circle system utilized Preoxygenation: Pre-oxygenation with 100% oxygen Induction Type: IV induction Ventilation: Mask ventilation without difficulty Laryngoscope Size: McGraph and 4 Grade View: Grade II Tube type: Oral Tube size: 7.0 mm Number of attempts: 1 Airway Equipment and Method: Stylet and Oral airway Placement Confirmation: ETT inserted through vocal cords under direct vision, positive ETCO2 and breath sounds checked- equal and bilateral Secured at: 22 cm Tube secured with: Tape Dental Injury: Teeth and Oropharynx as per pre-operative assessment

## 2022-03-06 NOTE — Transfer of Care (Signed)
Immediate Anesthesia Transfer of Care Note  Patient: Darrell Kelley  Procedure(s) Performed: OPEN T8-9 LAMINECTOMY FOR CYST FENESTRATION (Spine Thoracic)  Patient Location: PACU  Anesthesia Type:General  Level of Consciousness: drowsy and patient cooperative  Airway & Oxygen Therapy: Patient Spontanous Breathing and Patient connected to face mask oxygen  Post-op Assessment: Report given to RN and Post -op Vital signs reviewed and stable  Post vital signs: Reviewed and stable  Last Vitals:  Vitals Value Taken Time  BP 117/85 03/06/22 1600  Temp    Pulse 86 03/06/22 1602  Resp 13 03/06/22 1602  SpO2 100 % 03/06/22 1602  Vitals shown include unvalidated device data.  Last Pain:  Vitals:   03/06/22 1140  TempSrc: Temporal  PainSc: 6          Complications: No notable events documented.

## 2022-03-07 ENCOUNTER — Encounter: Payer: Self-pay | Admitting: Neurosurgery

## 2022-03-07 DIAGNOSIS — Z91012 Allergy to eggs: Secondary | ICD-10-CM | POA: Diagnosis not present

## 2022-03-07 DIAGNOSIS — M4804 Spinal stenosis, thoracic region: Secondary | ICD-10-CM | POA: Diagnosis present

## 2022-03-07 DIAGNOSIS — Z9109 Other allergy status, other than to drugs and biological substances: Secondary | ICD-10-CM | POA: Diagnosis not present

## 2022-03-07 DIAGNOSIS — G96191 Perineural cyst: Secondary | ICD-10-CM | POA: Diagnosis present

## 2022-03-07 DIAGNOSIS — Z974 Presence of external hearing-aid: Secondary | ICD-10-CM | POA: Diagnosis not present

## 2022-03-07 DIAGNOSIS — Z87442 Personal history of urinary calculi: Secondary | ICD-10-CM | POA: Diagnosis not present

## 2022-03-07 DIAGNOSIS — Z880 Allergy status to penicillin: Secondary | ICD-10-CM | POA: Diagnosis not present

## 2022-03-07 DIAGNOSIS — G96198 Other disorders of meninges, not elsewhere classified: Secondary | ICD-10-CM | POA: Diagnosis present

## 2022-03-07 DIAGNOSIS — Z882 Allergy status to sulfonamides status: Secondary | ICD-10-CM | POA: Diagnosis not present

## 2022-03-07 DIAGNOSIS — G930 Cerebral cysts: Secondary | ICD-10-CM | POA: Diagnosis present

## 2022-03-07 DIAGNOSIS — Z8249 Family history of ischemic heart disease and other diseases of the circulatory system: Secondary | ICD-10-CM | POA: Diagnosis not present

## 2022-03-07 DIAGNOSIS — M5114 Intervertebral disc disorders with radiculopathy, thoracic region: Secondary | ICD-10-CM | POA: Diagnosis present

## 2022-03-07 MED ORDER — METHOCARBAMOL 500 MG PO TABS: 500.0000 mg | ORAL_TABLET | Freq: Four times a day (QID) | ORAL | 0 refills | Status: DC | PRN

## 2022-03-07 MED ORDER — OXYCODONE HCL 5 MG PO TABS: 5.0000 mg | ORAL_TABLET | ORAL | 0 refills | Status: AC | PRN

## 2022-03-07 NOTE — Discharge Summary (Signed)
Physician Discharge Summary  Patient ID: Darrell Kelley MRN: [id number] DOB/AGE: [birth year] 56 y.o.  Admit date: 03/06/2022 Discharge date: 03/07/2022  Admission Diagnoses: M54.14 Thoracic radiculopathy, G93.0 Arachnoid cyst    Discharge Diagnoses:  Principal Problem:   Intradural arachnoid cyst of spine Active Problems:   Thoracic radiculitis   Discharged Condition: good  Hospital Course: CLEM WISENBAKER is a 56 y.o presenting with thoracic radiculopathy and a thoracic arachnoid cyst s/p T8-9 laminectomy for intradural cyst fenestration. His intraoperative course was uncomplicated and he was admitted overnight for pain management and therapy evaluation.  He did well and was able to discharge home without any further needs on postop day 1.  He was given prescriptions for oxycodone and Robaxin and was instructed to restart his home meloxicam and stool softener.  Consults: None  Significant Diagnostic Studies: none  Treatments: surgery: As above.  Please see separately dictated operative report for further details.  Discharge Exam: Blood pressure 110/60, pulse (!) 102, temperature 98.8 F (37.1 C), resp. rate 16, height 6' (1.829 m), weight 102.5 kg, SpO2 94 %.  AA Ox3 CNI   Strength:5/5 throughout  Incision with mildly blood tinged post-op dressing. No signs of CSF    Disposition: Discharge disposition: 01-Home or Self Care       Discharge Instructions     Incentive spirometry RT   Complete by: As directed    Increase activity slowly   Complete by: As directed    Remove dressing in 48 hours   Complete by: As directed       Allergies as of 03/07/2022       Reactions   Egg [eggs Or Egg-derived Products] Anaphylaxis   With large amounts   Penicillins Hives   Sulfa Antibiotics Hives   Wheat Bran Diarrhea        Medication List     STOP taking these medications    traMADol 50 MG tablet Commonly known as: ULTRAM       TAKE these  medications    acetaminophen 500 MG tablet Commonly known as: TYLENOL Take 1,000 mg by mouth every 8 (eight) hours as needed.   EPINEPHrine 0.3 mg/0.3 mL Soaj injection Commonly known as: EPI-PEN Inject 0.3 mg into the muscle as needed for anaphylaxis.   escitalopram 5 MG tablet Commonly known as: LEXAPRO Take 5 mg by mouth daily.   methocarbamol 500 MG tablet Commonly known as: ROBAXIN Take 1 tablet (500 mg total) by mouth every 6 (six) hours as needed for muscle spasms.   oxyCODONE 5 MG immediate release tablet Commonly known as: Oxy IR/ROXICODONE Take 1-2 tablets (5-10 mg total) by mouth every 4 (four) hours as needed for up to 5 days for moderate pain ((score 4 to 6)).   sertraline 100 MG tablet Commonly known as: ZOLOFT Take 200 mg by mouth daily.   tiZANidine 2 MG tablet Commonly known as: ZANAFLEX Take 1 tablet by mouth every 6 (six) hours as needed for muscle spasms.         Signed: Loleta Dicker 03/07/2022, 11:44 AM

## 2022-03-07 NOTE — Evaluation (Signed)
Occupational Therapy Evaluation Patient Details Name: Darrell Kelley MRN: [id number] DOB: [birth year] Today's Date: 03/07/2022   History of Present Illness 56 y/o male s/p open T8-9 laminectomy for cyst fenestration on 03/06/22. No significant PMH.   Clinical Impression   Prior to admission, pt was independent with ADLs, IADLs, and functional mobility without an AD. Pt still drives and works full time. During evaluation, pt completed bed mobility, functional mobility to the bathroom, and toilet transfer with supervision. Pt is currently requiring Min A for LB dressing and anticipate set up A for UB ADLs. Pt was educated on LB dressing AE, log roll technique, and no BLT. Pt will benefit from acute OT to increase overall independence in the areas of ADLs and functional mobility in order to safely discharge home. Anticipate pt to make necessary progress with ADL tasks while in hospital to not require formal f/u therapy at D/C.   Recommendations for follow up therapy are one component of a multi-disciplinary discharge planning process, led by the attending physician.  Recommendations may be updated based on patient status, additional functional criteria and insurance authorization.   Follow Up Recommendations  No OT follow up     Assistance Recommended at Discharge Intermittent Supervision/Assistance  Patient can return home with the following A little help with walking and/or transfers;A little help with bathing/dressing/bathroom;Assistance with cooking/housework;Assist for transportation;Help with stairs or ramp for entrance    Functional Status Assessment  Patient has had a recent decline in their functional status and demonstrates the ability to make significant improvements in function in a reasonable and predictable amount of time.  Equipment Recommendations  Other (comment) Management consultant)    Recommendations for Other Services       Precautions / Restrictions  Precautions Precautions: Back Restrictions Weight Bearing Restrictions: No      Mobility Bed Mobility Overal bed mobility: Needs Assistance Bed Mobility: Sidelying to Sit, Sit to Supine   Sidelying to sit: Supervision   Sit to supine: Supervision   General bed mobility comments: VC for log roll technique    Transfers Overall transfer level: Needs assistance Equipment used: Rolling walker (2 wheels) Transfers: Sit to/from Stand Sit to Stand: Supervision           General transfer comment: STS from EOB and regular height toilet      Balance                                           ADL either performed or assessed with clinical judgement   ADL Overall ADL's : Needs assistance/impaired     Grooming: Supervision/safety;Standing               Lower Body Dressing: Minimal assistance;Sitting/lateral leans Lower Body Dressing Details (indicate cue type and reason): to don shoes Toilet Transfer: Ambulation;Regular Toilet;Grab bars;Supervision/safety   Toileting- Clothing Manipulation and Hygiene: Supervision/safety;Sit to/from stand       Functional mobility during ADLs: Supervision/safety (to the bathroom, no AD)       Vision Baseline Vision/History: 1 Wears glasses Patient Visual Report: No change from baseline       Perception     Praxis      Pertinent Vitals/Pain Pain Assessment Pain Assessment: 0-10 Pain Score: 6  Pain Location: incision site - back Pain Descriptors / Indicators: Guarding, Grimacing Pain Intervention(s): Monitored during session, Repositioned     Hand Dominance  Extremity/Trunk Assessment Upper Extremity Assessment Upper Extremity Assessment: Overall WFL for tasks assessed   Lower Extremity Assessment Lower Extremity Assessment: Overall WFL for tasks assessed   Cervical / Trunk Assessment Cervical / Trunk Assessment: Back Surgery   Communication Communication Communication: No difficulties    Cognition Arousal/Alertness: Awake/alert Behavior During Therapy: WFL for tasks assessed/performed Overall Cognitive Status: Within Functional Limits for tasks assessed                                       General Comments       Exercises Other Exercises Other Exercises: OT provided education re: role of OT, OT POC, post acute recs, sitting up for all meals, EOB/OOB mobility with assistance, home/fall safety, LB dressing AE (reacher) for donning pants., log roll technique, no BLT   Shoulder Instructions      Home Living Family/patient expects to be discharged to:: Private residence Living Arrangements: Spouse/significant other Available Help at Discharge: Family;Available PRN/intermittently Type of Home: House Home Access: Stairs to enter [employer] of Steps: 6 Entrance Stairs-Rails: Right;Left Home Layout: Two level;Able to live on main level with bedroom/bathroom     Bathroom Shower/Tub: Teacher, early years/pre: Standard     Home Equipment: None (walking stick)   Additional Comments: Wife is taking off work this week to be home with pt, son could also stay with pt for a week after wife goes back to work.      Prior Functioning/Environment Prior Level of Function : Independent/Modified Independent;Working/employed;Driving             Mobility Comments: Independent no AD, denies history of falls ADLs Comments: Independent, driving, works full time as Therapist, sports at [employer]        OT Problem List: Decreased activity tolerance;Impaired balance (sitting and/or standing);Decreased knowledge of precautions;Decreased knowledge of use of DME or AE;Decreased range of motion;Pain      OT Treatment/Interventions: Self-care/ADL training;Therapeutic exercise;Therapeutic activities;Energy conservation;DME and/or AE instruction;Patient/family education;Balance training    OT Goals(Current goals can be found in the care plan  section) Acute Rehab OT Goals Patient Stated Goal: go home OT Goal Formulation: With patient/family Time For Goal Achievement: 03/21/22 Potential to Achieve Goals: Good   OT Frequency: Min 2X/week    Co-evaluation              AM-PAC OT "6 Clicks" Daily Activity     Outcome Measure Help from another person eating meals?: None Help from another person taking care of personal grooming?: A Little Help from another person toileting, which includes using toliet, bedpan, or urinal?: A Little Help from another person bathing (including washing, rinsing, drying)?: A Little Help from another person to put on and taking off regular upper body clothing?: None Help from another person to put on and taking off regular lower body clothing?: A Little 6 Click Score: 20   End of Session Nurse Communication: Mobility status  Activity Tolerance: Patient tolerated treatment well Patient left: in bed;with call bell/phone within reach;with family/visitor present  OT Visit Diagnosis: Other abnormalities of gait and mobility (R26.89);Pain Pain - Right/Left:  (back)                Time: 7939-0300 OT Time Calculation (min): 12 min Charges:  OT General Charges $OT Visit: 1 Visit OT Evaluation $OT Eval Low Complexity: [place] MS, OTR/L ascom [phone]  03/07/22, 12:58 PM

## 2022-03-07 NOTE — Discharge Instructions (Signed)
NEUROSURGERY DISCHARGE INSTRUCTIONS  Admission diagnosis: Intradural arachnoid cyst of spine [G96.198]  Operative procedure: T8-9 lamiectomy for intradural cyst  What to do after you leave the hospital:  Recommended diet: regular diet. Increase protein intake to promote wound healing.  Recommended activity: no lifting, driving, or strenuous exercise for 2 weeks . You should walk multiple times per day  Special Instructions  No straining, no heavy lifting > 10lbs x 4 weeks.  Keep incision area clean and dry. May shower in 2 days. No baths or pools for 6 weeks.  Please remove dressing tomorrow, no need to apply a bandage afterwards  You have sutures or staples that will be removed in clinic.   Please take pain medications as directed. Take a stool softener if on pain medications   Please Report any of the following: Nausea or Vomiting, Temperature is greater than 101.49F (38.1C) degrees, Dizziness, Abdominal Pain, Difficulty Breathing or Shortness of Breath, Inability to Eat, drink Fluids, or Take medications, Bleeding, swelling, or drainage from surgical incision sites, New numbness or weakness, and Bowel or bladder dysfunction to the neurosurgeon on call at [phone]  Additional Follow up appointments Please follow up with Geronimo Boot PA-C in [place] [facility] as scheduled in 2-3 weeks   Please see below for scheduled appointments:  Future Appointments  Date Time Provider [place]  03/21/2022  1:30 PM Geronimo Boot, PA-C [facility] None  04/20/2022  1:45 PM Meade Maw, MD [facility] None  05/30/2022  9:30 AM Meade Maw, MD AS[facility] None

## 2022-03-07 NOTE — Evaluation (Signed)
Physical Therapy Evaluation Patient Details Name: Darrell Kelley MRN: [id number] DOB: 11-18-1965 Today's Date: 03/07/2022  History of Present Illness  56 y/o male s/p open T8-9 laminectomy for cyst fenestration on 03/06/22. No significant PMH.  Clinical Impression  Patient s/p above procedure. PTA, patient lives with wife and was independent, working as Therapist, sports, and driving. Educated patient on back precautions and importance of mobility, patient verbalized understanding. Overall, functioning at supervision level for mobility. Initially, ambulating with RW but able to progress to no AD with no LOB. Able to negotiate 4 stairs with R handrail and supervision to safely access home environment. Wife present and supportive throughout. Patient will benefit from skilled PT services during acute stay to address listed deficits. No PT follow up recommended at this time.        Recommendations for follow up therapy are one component of a multi-disciplinary discharge planning process, led by the attending physician.  Recommendations may be updated based on patient status, additional functional criteria and insurance authorization.  Follow Up Recommendations No PT follow up      Assistance Recommended at Discharge Intermittent Supervision/Assistance  Patient can return home with the following  A little help with walking and/or transfers;A little help with bathing/dressing/bathroom;Assistance with cooking/housework;Assist for transportation;Help with stairs or ramp for entrance    Equipment Recommendations None recommended by PT  Recommendations for Other Services       Functional Status Assessment Patient has had a recent decline in their functional status and demonstrates the ability to make significant improvements in function in a reasonable and predictable amount of time.     Precautions / Restrictions Precautions Precautions: Back Restrictions Weight Bearing Restrictions: No       Mobility  Bed Mobility Overal bed mobility: Needs Assistance Bed Mobility: Rolling, Sidelying to Sit Rolling: Supervision Sidelying to sit: Supervision       General bed mobility comments: cues for log roll technique but able to complete with supervision    Transfers Overall transfer level: Needs assistance Equipment used: Rolling Truda Staub (2 wheels) Transfers: Sit to/from Stand Sit to Stand: Supervision           General transfer comment: supervision for safety. Cues for hand placement.    Ambulation/Gait Ambulation/Gait assistance: Supervision Gait Distance (Feet): 200 Feet (200) Assistive device: Rolling Arleny Kruger (2 wheels), None Gait Pattern/deviations: Step-through pattern, Decreased stride length Gait velocity: decreased     General Gait Details: initial 200' with RW but minimal use. Able to ambulate with no AD and supervision with no LOB  Stairs Stairs: Yes Stairs assistance: Supervision Stair Management: One rail Right, Step to pattern, Forwards Number of Stairs: 4 General stair comments: supervision for safety, use of R hand rail  Wheelchair Mobility    Modified Rankin (Stroke Patients Only)       Balance Overall balance assessment: Mild deficits observed, not formally tested                                           Pertinent Vitals/Pain Pain Assessment Pain Assessment: 0-10 Pain Score: 6  Pain Location: incision site - back Pain Descriptors / Indicators: Guarding, Grimacing Pain Intervention(s): Monitored during session, Repositioned    Home Living Family/patient expects to be discharged to:: Private residence Living Arrangements: Spouse/significant other Available Help at Discharge: Family;Available PRN/intermittently Type of Home: House Home Access: Stairs to enter Entrance Stairs-Rails: Right;Left Entrance  Stairs-Number of Steps: 6   Home Layout: Two level;Able to live on main level with bedroom/bathroom Home  Equipment: None (walking stick)      Prior Function Prior Level of Function : Independent/Modified Independent;Working/employed;Driving             Mobility Comments: works as Therapist, sports at [employer]        Extremity/Trunk Assessment   Upper Extremity Assessment Upper Extremity Assessment: Defer to OT evaluation    Lower Extremity Assessment Lower Extremity Assessment: Overall WFL for tasks assessed    Cervical / Trunk Assessment Cervical / Trunk Assessment: Back Surgery  Communication   Communication: No difficulties  Cognition Arousal/Alertness: Awake/alert Behavior During Therapy: WFL for tasks assessed/performed Overall Cognitive Status: Within Functional Limits for tasks assessed                                          General Comments      Exercises     Assessment/Plan    PT Assessment Patient needs continued PT services  PT Problem List Decreased strength;Decreased activity tolerance;Decreased mobility;Decreased balance;Decreased knowledge of precautions       PT Treatment Interventions DME instruction;Gait training;Stair training;Functional mobility training;Therapeutic activities;Therapeutic exercise;Balance training;Patient/family education    PT Goals (Current goals can be found in the Care Plan section)  Acute Rehab PT Goals Patient Stated Goal: to reduce pain and go home PT Goal Formulation: With patient/family Time For Goal Achievement: 03/21/22 Potential to Achieve Goals: Good    Frequency BID     Co-evaluation               AM-PAC PT "6 Clicks" Mobility  Outcome Measure Help needed turning from your back to your side while in a flat bed without using bedrails?: A Little Help needed moving from lying on your back to sitting on the side of a flat bed without using bedrails?: A Little Help needed moving to and from a bed to a chair (including a wheelchair)?: A Little Help needed  standing up from a chair using your arms (e.g., wheelchair or bedside chair)?: A Little Help needed to walk in hospital room?: A Little Help needed climbing 3-5 steps with a railing? : A Little 6 Click Score: 18    End of Session Equipment Utilized During Treatment: Gait belt Activity Tolerance: Patient tolerated treatment well Patient left: in chair;with call bell/phone within reach;with family/visitor present Nurse Communication: Mobility status PT Visit Diagnosis: Unsteadiness on feet (R26.81);Muscle weakness (generalized) (M62.81);Difficulty in walking, not elsewhere classified (R26.2)    Time: 9735-3299 PT Time Calculation (min) (ACUTE ONLY): 36 min   Charges:   PT Evaluation $PT Eval Low Complexity: 1 Low PT Treatments $Therapeutic Activity: 8-22 mins        Bereket Gernert A. Gilford Rile PT, DPT [facility] - Acute Rehabilitation Services   Laverda Stribling A Jalyne Brodzinski 03/07/2022, 10:33 AM

## 2022-03-07 NOTE — Progress Notes (Signed)
Physical Therapy Treatment Patient Details Name: Darrell Kelley MRN: [id number] DOB: 09-21-1965 Today's Date: 03/07/2022   History of Present Illness 56 y/o male s/p open T8-9 laminectomy for cyst fenestration on 03/06/22. No significant PMH.    PT Comments    Patient progressing well towards physical therapy goals. Patient complaining of 6/10 pain in back. Good recall of log roll technique for bed mobility. Able to complete transfers and ambulation with no AD with supervision. Requesting pain meds, notified RN. Patient politely declined exercises this date due to being discharged and avoiding exacerbation of pain. D/c plan remains appropriate.    Recommendations for follow up therapy are one component of a multi-disciplinary discharge planning process, led by the attending physician.  Recommendations may be updated based on patient status, additional functional criteria and insurance authorization.  Follow Up Recommendations  No PT follow up     Assistance Recommended at Discharge Intermittent Supervision/Assistance  Patient can return home with the following A little help with walking and/or transfers;A little help with bathing/dressing/bathroom;Assistance with cooking/housework;Assist for transportation;Help with stairs or ramp for entrance   Equipment Recommendations  None recommended by PT    Recommendations for Other Services       Precautions / Restrictions Precautions Precautions: Back Restrictions Weight Bearing Restrictions: No     Mobility  Bed Mobility Overal bed mobility: Modified Independent Bed Mobility: Rolling, Sidelying to Sit Rolling: Supervision Sidelying to sit: Supervision       General bed mobility comments: good recall of log roll technique    Transfers Overall transfer level: Needs assistance Equipment used: None Transfers: Sit to/from Stand Sit to Stand: Supervision           General transfer comment: supervision for safety     Ambulation/Gait Ambulation/Gait assistance: Supervision Gait Distance (Feet): 250 Feet Assistive device: None Gait Pattern/deviations: Step-through pattern, Decreased stride length Gait velocity: decreased     General Gait Details: supervision for safety. No LOB noted   Stairs Stairs: Yes Stairs assistance: Supervision Stair Management: One rail Right, Alternating pattern, Forwards Number of Stairs: 4 General stair comments: encouraged alternating step pattern with good completion . Supervision for safety   Wheelchair Mobility    Modified Rankin (Stroke Patients Only)       Balance Overall balance assessment: Mild deficits observed, not formally tested                                          Cognition Arousal/Alertness: Awake/alert Behavior During Therapy: WFL for tasks assessed/performed Overall Cognitive Status: Within Functional Limits for tasks assessed                                          Exercises      General Comments        Pertinent Vitals/Pain Pain Assessment Pain Assessment: 0-10 Pain Score: 6  Pain Location: incision site - back Pain Descriptors / Indicators: Guarding, Grimacing Pain Intervention(s): Monitored during session, Patient requesting pain meds-RN notified    Home Living Family/patient expects to be discharged to:: Private residence Living Arrangements: Spouse/significant other Available Help at Discharge: Family;Available PRN/intermittently Type of Home: House Home Access: Stairs to enter Entrance Stairs-Rails: Right;Left Entrance Stairs-Number of Steps: 6   Home Layout: Two level;Able to live on main level with  bedroom/bathroom Home Equipment: None (walking stick) Additional Comments: Wife is taking off work this week to be home with pt, son could also stay with pt for a week after wife goes back to work.    Prior Function            PT Goals (current goals can now be found in the  care plan section) Acute Rehab PT Goals Patient Stated Goal: to reduce pain and go home PT Goal Formulation: With patient/family Time For Goal Achievement: 03/21/22 Potential to Achieve Goals: Good Progress towards PT goals: Progressing toward goals    Frequency    BID      PT Plan Current plan remains appropriate    Co-evaluation              AM-PAC PT "6 Clicks" Mobility   Outcome Measure  Help needed turning from your back to your side while in a flat bed without using bedrails?: None Help needed moving from lying on your back to sitting on the side of a flat bed without using bedrails?: None Help needed moving to and from a bed to a chair (including a wheelchair)?: A Little Help needed standing up from a chair using your arms (e.g., wheelchair or bedside chair)?: A Little Help needed to walk in hospital room?: A Little Help needed climbing 3-5 steps with a railing? : A Little 6 Click Score: 20    End of Session Equipment Utilized During Treatment: Gait belt Activity Tolerance: Patient tolerated treatment well Patient left: in chair;with call bell/phone within reach;with family/visitor present Nurse Communication: Mobility status;Patient requests pain meds PT Visit Diagnosis: Unsteadiness on feet (R26.81);Muscle weakness (generalized) (M62.81);Difficulty in walking, not elsewhere classified (R26.2)     Time: 8756-4332 PT Time Calculation (min) (ACUTE ONLY): 20 min  Charges:  $Therapeutic Activity: 8-22 mins                     Oasis Goehring A. Gilford Rile PT, DPT [facility] - Acute Rehabilitation Services    Jimi Giza A Jen Eppinger 03/07/2022, 1:42 PM

## 2022-03-07 NOTE — TOC Progression Note (Signed)
Transition of Care [facility]) - Progression Note    Patient Details  Name: Darrell Kelley MRN: [id number] Date of Birth: 10-06-1965  Transition of Care [facility]) CM/SW [place], RN Phone Number: 03/07/2022, 1:22 PM  Clinical Narrative:    Met with the patient  and his wife in the room, they have a walking stick at home and did well with PT, no DME needed, no HH needed, she will provide transportation   Expected Discharge Plan: Home/Self Care Barriers to Discharge: Barriers Resolved  Expected Discharge Plan and Services Expected Discharge Plan: Home/Self Care   Discharge Planning Services: CM Consult   Living arrangements for the past 2 months: Single Family Home Expected Discharge Date: 03/07/22                                     Social Determinants of Health (SDOH) Interventions    Readmission Risk Interventions     No data to display

## 2022-03-07 NOTE — Progress Notes (Signed)
    Attending Progress Note  History: Darrell Kelley is s/p T8-9 laminectomy for intradural arachnoid cyst fenestration.   POD1: NAEO. Pt reports expected thoracic back pain.   Physical Exam: Vitals:   03/07/22 0536 03/07/22 0812  BP: 114/72 110/60  Pulse: 88 (!) 102  Resp: 17 16  Temp: 97.8 F (36.6 C) 98.8 F (37.1 C)  SpO2: 93% 94%    AA Ox3 CNI  Strength:5/5 throughout  Incision with mildly blood tinged post-op dressing. No signs of CSF  Data:  Other tests/results: none   Assessment/Plan:  Darrell Kelley is a 57 y.o s/p T8-9 laminectomy for intradural arachnoid cyst fenestration resulting in thoracic radiculopathy   - mobilize - pain control - DVT prophylaxis - PTOT  Cooper Render PA-C Department of Neurosurgery

## 2022-03-07 NOTE — Anesthesia Postprocedure Evaluation (Signed)
Anesthesia Post Note  Patient: Darrell Kelley  Procedure(s) Performed: OPEN T8-9 LAMINECTOMY FOR CYST FENESTRATION (Spine Thoracic)  Patient location during evaluation: PACU Anesthesia Type: General Level of consciousness: awake and alert Pain management: pain level controlled Vital Signs Assessment: post-procedure vital signs reviewed and stable Respiratory status: spontaneous breathing, nonlabored ventilation, respiratory function stable and patient connected to nasal cannula oxygen Cardiovascular status: blood pressure returned to baseline and stable Postop Assessment: no apparent nausea or vomiting Anesthetic complications: no   No notable events documented.   Last Vitals:  Vitals:   03/06/22 2338 03/07/22 0536  BP: 103/68 114/72  Pulse: 87 88  Resp: 17 17  Temp: 36.8 C 36.6 C  SpO2: 93% 93%    Last Pain:  Vitals:   03/07/22 0059  TempSrc:   PainSc: 7                  Arita Miss

## 2022-03-08 ENCOUNTER — Telehealth: Payer: Self-pay

## 2022-03-08 LAB — SURGICAL PATHOLOGY

## 2022-03-08 NOTE — Telephone Encounter (Signed)
-----   Message from Peggyann Shoals sent at 03/08/2022  8:35 AM EST ----- Regarding: postop FYI Contact: [phone] T8-9 laminectomy for cyst fenestration on 03/06/22 Patient reporting a low grade fever 101 but he took tylenol and that brought down the fever. He is still having neck stiffness but he thinks it's due to the the muscle spasms.  He just wants to make this make the office aware of these symptoms.

## 2022-03-08 NOTE — Telephone Encounter (Signed)
I spoke with Darrell Kelley and relayed Danielle's message. He denies chest pain or SOB. He states he has been taking frequent walks and doing deep breathing and he will continue to do so. I encouraged him to contact us with future any concerns/needs.

## 2022-03-08 NOTE — Telephone Encounter (Signed)
Darrell Kelley called in again regarding temp of 102 at time of call. He reports taking tylenol at 3pm. Discussed with Dr Izora Ribas - if he has symptoms of shortness of breath or burning on urination, he can come in for testing, but he would not expect a wound infection 2 days after surgery. He recommends deep breathing, incentive spirometer.   I notified Darrell Kelley of the above and I informed him that it is not uncommon to see a high temperature a couple of days after a surgery like this. He verbalized understanding.

## 2022-03-09 ENCOUNTER — Telehealth: Payer: Self-pay | Admitting: Neurosurgery

## 2022-03-09 NOTE — Telephone Encounter (Signed)
Spoke with Mr. Lamison regarding his pathology results.  As expected, the pathology showed findings consistent with an arachnoid cyst.  There is no worry about neoplasm.

## 2022-03-20 NOTE — Progress Notes (Unsigned)
   REFERRING PHYSICIAN:  Sofie Hartigan, Md [address] [place],  [place] [address]  DOS: 03/06/22 Thoracic laminectomy at T8-9 for fenestration of intradural arachnoid cyst   HISTORY OF PRESENT ILLNESS: Darrell Kelley is approximately 2 weeks status post thoracic laminectomy at T8-9 for fenestration of intradural arachnoid cyst . Was given oxycodone and robaxin on discharge from the hospital. He was to restart his mobic as well.   He is having some pain in his mid back. He has some initial pain radiating around his ribs/chest but this has improved. He notes some weakness in his upper extremities after he is doing any prolonged activities (decorating cookies).   He is taking ultram, mobic, and prn robaxin. Had to take oxycodone yesterday for increased pain.    PHYSICAL EXAMINATION:  General: Patient is well developed, well nourished, calm, collected, and in no apparent distress.   NEUROLOGICAL:  General: In no acute distress.   Awake, alert, oriented to person, place, and time.  Pupils equal round and reactive to light.  Facial tone is symmetric.     Strength: Side Biceps Triceps Deltoid Interossei Grip Wrist Ext. Wrist Flex.  R '5 5 5 5 5 5 5  '$ L '5 5 5 5 5 5 5           '$ Side Iliopsoas Quads Hamstring PF DF EHL  R '5 5 5 5 5 5  '$ L '5 5 5 5 5 5   '$ Incision c/d/i   ROS (Neurologic):  Negative except as noted above  IMAGING: Nothing new to review.   ASSESSMENT/PLAN:  Darrell Kelley is doing well s/p above surgery. Treatment options reviewed with patient and following plan made:   - I have advised the patient to lift up to 10 pounds until 6 weeks after surgery (follow up with Dr. Izora Ribas).  - Reviewed wound care.  - No bending, twisting, or lifting.  - Continue on current medications including prn ultram, prn robaxin.   - His subjective weakness complaints are likely pain mediated. Strength testing today shows 5/5 when he concentrates and gives his best effort.   - Follow up as scheduled in 4 weeks and prn.   Advised to contact the office if any questions or concerns arise.  Geronimo Boot PA-C Department of neurosurgery

## 2022-03-21 ENCOUNTER — Encounter: Payer: Self-pay | Admitting: Orthopedic Surgery

## 2022-03-21 ENCOUNTER — Ambulatory Visit (INDEPENDENT_AMBULATORY_CARE_PROVIDER_SITE_OTHER): Payer: 59 | Admitting: Orthopedic Surgery

## 2022-03-21 ENCOUNTER — Ambulatory Visit: Payer: 59 | Admitting: Neurosurgery

## 2022-03-21 VITALS — BP 119/90 | HR 100 | Temp 98.3°F

## 2022-03-21 DIAGNOSIS — Z9889 Other specified postprocedural states: Secondary | ICD-10-CM

## 2022-03-21 DIAGNOSIS — M5414 Radiculopathy, thoracic region: Secondary | ICD-10-CM

## 2022-03-21 DIAGNOSIS — Z09 Encounter for follow-up examination after completed treatment for conditions other than malignant neoplasm: Secondary | ICD-10-CM

## 2022-03-21 DIAGNOSIS — G930 Cerebral cysts: Secondary | ICD-10-CM

## 2022-04-20 ENCOUNTER — Encounter: Payer: Self-pay | Admitting: Neurosurgery

## 2022-04-20 ENCOUNTER — Ambulatory Visit: Payer: Commercial Managed Care - PPO | Admitting: Neurosurgery

## 2022-04-20 VITALS — BP 130/82 | Ht 72.0 in | Wt 225.0 lb

## 2022-04-20 DIAGNOSIS — G930 Cerebral cysts: Secondary | ICD-10-CM

## 2022-04-20 DIAGNOSIS — Z09 Encounter for follow-up examination after completed treatment for conditions other than malignant neoplasm: Secondary | ICD-10-CM

## 2022-04-20 DIAGNOSIS — M5414 Radiculopathy, thoracic region: Secondary | ICD-10-CM

## 2022-04-20 NOTE — Progress Notes (Signed)
   REFERRING PHYSICIAN:  Sofie Hartigan, Md [place],  [place] [address]  DOS: 03/06/22 Thoracic laminectomy at T8-9 for fenestration of intradural arachnoid cyst   HISTORY OF PRESENT ILLNESS: Darrell Kelley is status post thoracic laminectomy at T8-9 for fenestration of intradural arachnoid cyst .   He is taking tramadol on occasion.  He is taking methocarbamol and meloxicam.  This is helping.  His rib cage related pain is better.  He is having some pain in between his shoulder blades.  PHYSICAL EXAMINATION:  General: Patient is well developed, well nourished, calm, collected, and in no apparent distress.   NEUROLOGICAL:  General: In no acute distress.   Awake, alert, oriented to person, place, and time.  Pupils equal round and reactive to light.  Facial tone is symmetric.     Strength: Side Biceps Triceps Deltoid Interossei Grip Wrist Ext. Wrist Flex.  R '5 5 5 5 5 5 5  '$ L '5 5 5 5 5 5 5           '$ Side Iliopsoas Quads Hamstring PF DF EHL  R '5 5 5 5 5 5  '$ L '5 5 5 5 5 5   '$ Incision c/d/i   ROS (Neurologic):  Negative except as noted above  IMAGING: Nothing new to review.   ASSESSMENT/PLAN:  Darrell Kelley is doing well s/p above surgery.   We will start physical therapy for him.  He will continue his current medication regimen.  He is not ready to return to work.  Will review his work status at his next appointment.  Meade Maw MD Department of neurosurgery

## 2022-04-25 ENCOUNTER — Ambulatory Visit: Payer: Commercial Managed Care - PPO | Attending: Neurosurgery | Admitting: Physical Therapy

## 2022-04-25 ENCOUNTER — Encounter: Payer: Self-pay | Admitting: Physical Therapy

## 2022-04-25 DIAGNOSIS — M2560 Stiffness of unspecified joint, not elsewhere classified: Secondary | ICD-10-CM | POA: Diagnosis not present

## 2022-04-25 DIAGNOSIS — M5414 Radiculopathy, thoracic region: Secondary | ICD-10-CM | POA: Diagnosis not present

## 2022-04-25 DIAGNOSIS — M546 Pain in thoracic spine: Secondary | ICD-10-CM | POA: Insufficient documentation

## 2022-04-25 DIAGNOSIS — M6281 Muscle weakness (generalized): Secondary | ICD-10-CM | POA: Insufficient documentation

## 2022-04-25 DIAGNOSIS — G930 Cerebral cysts: Secondary | ICD-10-CM | POA: Diagnosis not present

## 2022-05-01 ENCOUNTER — Ambulatory Visit: Payer: Commercial Managed Care - PPO | Admitting: Physical Therapy

## 2022-05-01 ENCOUNTER — Encounter: Payer: Self-pay | Admitting: Neurosurgery

## 2022-05-01 DIAGNOSIS — M2560 Stiffness of unspecified joint, not elsewhere classified: Secondary | ICD-10-CM | POA: Diagnosis not present

## 2022-05-01 DIAGNOSIS — M6281 Muscle weakness (generalized): Secondary | ICD-10-CM | POA: Diagnosis not present

## 2022-05-01 DIAGNOSIS — G930 Cerebral cysts: Secondary | ICD-10-CM | POA: Diagnosis not present

## 2022-05-01 DIAGNOSIS — Z9889 Other specified postprocedural states: Secondary | ICD-10-CM

## 2022-05-01 DIAGNOSIS — M5414 Radiculopathy, thoracic region: Secondary | ICD-10-CM | POA: Diagnosis not present

## 2022-05-01 DIAGNOSIS — M546 Pain in thoracic spine: Secondary | ICD-10-CM | POA: Diagnosis not present

## 2022-05-01 NOTE — Therapy (Signed)
OUTPATIENT PHYSICAL THERAPY THORACOLUMBAR EVALUATION   Patient Name: Darrell Kelley MRN: [id number] DOB:Aug 30, 1965, 57 y.o., male Today's Date: 04/25/2022  END OF SESSION:  PT End of Session - 05/01/22 0957     Visit Number 1    Number of Visits 9    Date for PT Re-Evaluation 05/23/22    PT Start Time 0725    PT Stop Time 0819    PT Time Calculation (min) 54 min    Activity Tolerance Patient limited by pain    Behavior During Therapy [facility] for tasks assessed/performed             Past Medical History:  Diagnosis Date   Anxiety    Depression    History of kidney stones    Hyperlipidemia    Wears hearing aid in both ears    Past Surgical History:  Procedure Laterality Date   CHOLECYSTECTOMY  2008   COLONOSCOPY WITH PROPOFOL N/A 07/28/2019   Procedure: COLONOSCOPY WITH BIOPSY;  Surgeon: Lucilla Lame, MD;  Location: [place];  Service: Endoscopy;  Laterality: N/A;   LAMINECTOMY N/A 03/06/2022   Procedure: OPEN T8-9 LAMINECTOMY FOR CYST FENESTRATION;  Surgeon: Meade Maw, MD;  Location: [facility];  Service: Neurosurgery;  Laterality: N/A;   TONSILLECTOMY AND ADENOIDECTOMY  1975   Patient Active Problem List   Diagnosis Date Noted   Intradural arachnoid cyst of spine 03/06/2022   Thoracic radiculitis 03/06/2022   Arachnoid cyst 12/13/2021   Special screening for malignant neoplasms, colon     PCP: Sofie [place], MD  REFERRING PROVIDER: Meade Maw, MD  REFERRING DIAG: G93.0 (ICD-10-CM) - Arachnoid cyst   Rationale for Evaluation and Treatment: Rehabilitation  THERAPY DIAG:  Pain in thoracic spine  Joint stiffness  Muscle weakness (generalized)  ONSET DATE: 03/06/22 (surgery date)  SUBJECTIVE:                                                                                                                                                                                           SUBJECTIVE STATEMENT: Pt. S/p thoracic  laminectomy at T8-9 on 03/06/22 and reports 4/10 back pain currently.  No radicular symptoms reported.  R hand dominant.  Pt. Has not returned to work at this time and needs to be 100% to return.  Pt. Enjoys shooting at the range.  Sidesleeping at night with proper posture.    PERTINENT HISTORY:  DOS: 03/06/22 Thoracic laminectomy at T8-9 for fenestration of intradural arachnoid cyst   HISTORY OF PRESENT ILLNESS: Darrell Kelley is status post thoracic laminectomy at T8-9 for fenestration of intradural arachnoid cyst .  He is taking tramadol on occasion.  He is taking methocarbamol and meloxicam.  This is helping.  His rib cage related pain is better.  He is having some pain in between his shoulder blades.   PHYSICAL EXAMINATION:  General: Patient is well developed, well nourished, calm, collected, and in no apparent distress.     NEUROLOGICAL:  General: In no acute distress.   Awake, alert, oriented to person, place, and time.  Pupils equal round and reactive to light.  Facial tone is symmetric.       Strength: Side Biceps Triceps Deltoid Interossei Grip Wrist Ext. Wrist Flex.  R '5 5 5 5 5 5 5  '$ L '5 5 5 5 5 5 5                                                                                                   '$ Side Iliopsoas Quads Hamstring PF DF EHL  R '5 5 5 5 5 5  '$ L '5 5 5 5 5 5    '$ Incision c/d/i     ROS (Neurologic):  Negative except as noted above   IMAGING: Nothing new to review.   ASSESSMENT/PLAN:  Darrell Kelley is doing well s/p above surgery.    We will start physical therapy for him.  He will continue his current medication regimen.  He is not ready to return to work.  Will review his work status at his next appointment.   Meade Maw MD Department of neurosurgery  PAIN:  Are you having pain? Yes: NPRS scale: 4/10 Pain location: T8-9 surgical site/ incision Pain description: aching/ sharp Aggravating factors: increase activity Relieving  factors: rest  PRECAUTIONS: None  WEIGHT BEARING RESTRICTIONS: No  FALLS:  Has patient fallen in last 6 months? No  LIVING ENVIRONMENT: Lives with: lives with their spouse Lives in: House/apartment Has following equipment at home: None  OCCUPATION: Therapist, sports at [employer]: Return to work without limitations/ pain.    NEXT MD VISIT: 05/30/22  OBJECTIVE:   DIAGNOSTIC FINDINGS:  CLINICAL DATA:  Thoracic laminectomy for cyst.   EXAM: OPERATIVE THORACIC SPINE 2 VIEW(S)   Radiation exposure index: 0.6215 mGy.   COMPARISON:  October 28, 2021.   FINDINGS: Two intraoperative fluoroscopic images were obtained of the lumbar spine. These demonstrate surgical hardware to proximally the T9 level.   IMPRESSION: Fluoroscopic guidance provided during lower thoracic surgery.     Electronically Signed   By: Marijo Conception M.D.   On: 03/06/2022 14:34  PATIENT SURVEYS:  FOTO initial 54/ goal 62  SCREENING FOR RED FLAGS: Bowel or bladder incontinence: No Spinal tumors: No Cauda equina syndrome: No Compression fracture: No Abdominal aneurysm: No  COGNITION: Overall cognitive status: Within functional limits for tasks assessed     SENSATION: WFL  MUSCLE LENGTH: Hamstrings: Right 72 deg; Left 75 deg  POSTURE: slight rounded sh. Posture/ pain with flexed posture  PALPATION: (+) tenderness around surgical incision/ minimal swelling noted.    LUMBAR ROM:   AROM eval  Flexion 25% limited  Extension 75% limited  Right lateral flexion WFL  Left lateral flexion WFL  Right rotation 25% limited  Left rotation 25% limited   (Blank rows = not tested)  LOWER EXTREMITY ROM:      Cervical AROM WFL  B shoulder AROM WNL  B LE AROM WNL    LOWER EXTREMITY MMT:    B UE strength grossly 5/5 MMT except L shoulder flexion 4/5 MMT, R shoulder flexion 4+/5 MMT.   Grip strength: L 102#/ R 88#  THORACIC/ LUMBAR SPECIAL TESTS:   N/A  FUNCTIONAL TESTS:  TBD  GAIT: Distance walked: in clinic Assistive device utilized: None Level of assistance: Complete Independence Comments: no issues  TODAY'S TREATMENT:                                                                                                                              DATE: 04/25/22  Evaluation/ Scifit L4 10 min. B UE/LE.  See HEP   PATIENT EDUCATION:  Education details: Access Code: [email or web address] Person educated: Patient Education method: Explanation, Demonstration, and Handouts Education comprehension: verbalized understanding and returned demonstration  HOME EXERCISE PROGRAM: Access Code: [email or web address]  URL: [email or web address] Date: 04/25/2022 Prepared by: Dorcas Carrow Exercises - Seated Thoracic Lumbar Extension with Pectoralis Stretch - 1 x daily - 7 x weekly - 3 sets - 10 reps - 5-10 sec hold - Standing Lumbar Spine Flexion Stretch Counter - 1 x daily - 7 x weekly - 3 sets - 10 reps - 5-10 sec hold - Prone Scapular Retraction Y - 1 x daily - 3 x weekly - 3 sets - 10 reps - Prone W Scapular Retraction - 1 x daily - 3 x weekly - 3 sets - 10 reps - Supine Transversus Abdominis Bracing with Heel Slide - 1 x daily - 7 x weekly - 3 sets - 10 reps   ASSESSMENT:  CLINICAL IMPRESSION: Patient is a pleasant 57 y.o. male who was seen today for physical therapy evaluation and treatment for back pain.  Pt. Reports persistent mid-back pain since surgery 11/23.  Pt. Limited by generalized muscle fatigue/ shoulder flexion weakness. Marked increase in back pain with prolonged standing in flexed posture.  (+) muscle tenderness around surgical incision.  Pt. Will benefit from skilled PT services to increase generalized strengthening/ pain mgmt. To promote return to work with no pain/limitations.    OBJECTIVE IMPAIRMENTS: decreased activity tolerance, decreased balance, decreased mobility, decreased ROM, decreased strength, hypomobility, impaired  flexibility, impaired sensation, improper body mechanics, postural dysfunction, and pain.   ACTIVITY LIMITATIONS: carrying, lifting, bending, standing, reach over head, locomotion level, and caring for others  PARTICIPATION LIMITATIONS: occupation and yard work  PERSONAL FACTORS: Pension scheme manager are also affecting patient's functional outcome.   REHAB POTENTIAL: Good  CLINICAL DECISION MAKING: Stable/uncomplicated  EVALUATION COMPLEXITY: Low   GOALS: Goals reviewed with patient? Yes  SHORT TERM GOALS: Target date: 05/09/22  Pt. Independent with HEP to increase B shoulder  strength 1/2 muscle grade to improve pain-free mobility/ lifting.  Baseline: see above Goal status: INITIAL   LONG TERM GOALS: Target date: 05/23/22  Pt. Will increase FOTO to 62 to improve pain-free mobility.   Baseline: initial 54 Goal status: INITIAL  2.  Pt. Will demonstrate proper lifting technique/ upright posture with no increase c/o pain.   Baseline: pain limited with prolonged standing.  Goal status: INITIAL  3.  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   Baseline: pt. Currently not working Goal status: INITIAL  PLAN:  PT FREQUENCY: 2x/week  PT DURATION: 4 weeks  PLANNED INTERVENTIONS: Therapeutic exercises, Therapeutic activity, Neuromuscular re-education, Patient/Family education, Self Care, Joint mobilization, Dry Needling, Cryotherapy, Moist heat, scar mobilization, and Manual therapy.  PLAN FOR NEXT SESSION: Reassess HEP/ check incision  Pura Spice, PT, DPT # [phone] 05/01/2022, 9:59 AM

## 2022-05-02 MED ORDER — METHYLPREDNISOLONE 4 MG PO TBPK: ORAL_TABLET | ORAL | 0 refills | Status: DC

## 2022-05-02 NOTE — Addendum Note (Signed)
Addended byGeronimo Boot on: 05/02/2022 03:33 PM   Modules accepted: Orders

## 2022-05-04 ENCOUNTER — Ambulatory Visit: Payer: Commercial Managed Care - PPO | Admitting: Physical Therapy

## 2022-05-04 DIAGNOSIS — G930 Cerebral cysts: Secondary | ICD-10-CM | POA: Diagnosis not present

## 2022-05-04 DIAGNOSIS — M546 Pain in thoracic spine: Secondary | ICD-10-CM | POA: Diagnosis not present

## 2022-05-04 DIAGNOSIS — M5414 Radiculopathy, thoracic region: Secondary | ICD-10-CM

## 2022-05-04 DIAGNOSIS — M2560 Stiffness of unspecified joint, not elsewhere classified: Secondary | ICD-10-CM | POA: Diagnosis not present

## 2022-05-04 DIAGNOSIS — M6281 Muscle weakness (generalized): Secondary | ICD-10-CM

## 2022-05-06 NOTE — Therapy (Signed)
OUTPATIENT PHYSICAL THERAPY THORACOLUMBAR TREATMENT  Patient Name: Darrell Kelley MRN: [id number] DOB:10-28-1965, 57 y.o., male Today's Date: 05/04/2022  END OF SESSION:  PT End of Session - 05/06/22 1746     Visit Number 3    Number of Visits 9    Date for PT Re-Evaluation 05/23/22    PT Start Time 5993    PT Stop Time 1520    PT Time Calculation (min) 47 min    Activity Tolerance Patient limited by pain    Behavior During Therapy [facility] for tasks assessed/performed             Past Medical History:  Diagnosis Date   Anxiety    Depression    History of kidney stones    Hyperlipidemia    Wears hearing aid in both ears    Past Surgical History:  Procedure Laterality Date   CHOLECYSTECTOMY  2008   COLONOSCOPY WITH PROPOFOL N/A 07/28/2019   Procedure: COLONOSCOPY WITH BIOPSY;  Surgeon: Lucilla Lame, MD;  Location: [place];  Service: Endoscopy;  Laterality: N/A;   LAMINECTOMY N/A 03/06/2022   Procedure: OPEN T8-9 LAMINECTOMY FOR CYST FENESTRATION;  Surgeon: Meade Maw, MD;  Location: [facility];  Service: Neurosurgery;  Laterality: N/A;   TONSILLECTOMY AND ADENOIDECTOMY  1975   Patient Active Problem List   Diagnosis Date Noted   Intradural arachnoid cyst of spine 03/06/2022   Thoracic radiculitis 03/06/2022   Arachnoid cyst 12/13/2021   Special screening for malignant neoplasms, colon     PCP: Sofie Hartigan, MD  REFERRING PROVIDER: Meade Maw, MD  REFERRING DIAG: G93.0 (ICD-10-CM) - Arachnoid cyst   Rationale for Evaluation and Treatment: Rehabilitation  THERAPY DIAG:  Pain in thoracic spine  Joint stiffness  Muscle weakness (generalized)  Radiculopathy of thoracic region  ONSET DATE: 03/06/22 (surgery date)  SUBJECTIVE:                                                                                                                                                                                           SUBJECTIVE  STATEMENT:  EVALUATION Pt. S/p thoracic laminectomy at T8-9 on 03/06/22 and reports 4/10 back pain currently.  No radicular symptoms reported.  R hand dominant.  Pt. Has not returned to work at this time and needs to be 100% to return.  Pt. Enjoys shooting at the range.  Sidesleeping at night with proper posture.    PERTINENT HISTORY:  DOS: 03/06/22 Thoracic laminectomy at T8-9 for fenestration of intradural arachnoid cyst   HISTORY OF PRESENT ILLNESS: Darrell Kelley is status post thoracic laminectomy at T8-9 for fenestration of intradural  arachnoid cyst .    He is taking tramadol on occasion.  He is taking methocarbamol and meloxicam.  This is helping.  His rib cage related pain is better.  He is having some pain in between his shoulder blades.   PHYSICAL EXAMINATION:  General: Patient is well developed, well nourished, calm, collected, and in no apparent distress.     NEUROLOGICAL:  General: In no acute distress.   Awake, alert, oriented to person, place, and time.  Pupils equal round and reactive to light.  Facial tone is symmetric.       Strength: Side Biceps Triceps Deltoid Interossei Grip Wrist Ext. Wrist Flex.  R '5 5 5 5 5 5 5  '$ L '5 5 5 5 5 5 5                                                                                                   '$ Side Iliopsoas Quads Hamstring PF DF EHL  R '5 5 5 5 5 5  '$ L '5 5 5 5 5 5    '$ Incision c/d/i     ROS (Neurologic):  Negative except as noted above   IMAGING: Nothing new to review.   ASSESSMENT/PLAN:  [name] is doing well s/p above surgery.    We will start physical therapy for him.  He will continue his current medication regimen.  He is not ready to return to work.  Will review his work status at his next appointment.   Meade Maw MD Department of neurosurgery  PAIN:  Are you having pain? Yes: NPRS scale: 4/10 Pain location: T8-9 surgical site/ incision Pain description: aching/ sharp Aggravating  factors: increase activity Relieving factors: rest  PRECAUTIONS: None  WEIGHT BEARING RESTRICTIONS: No  FALLS:  Has patient fallen in last 6 months? 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    NEXT MD VISIT: 05/30/22  OBJECTIVE:   DIAGNOSTIC FINDINGS:  CLINICAL DATA:  Thoracic laminectomy for cyst.   EXAM: OPERATIVE THORACIC SPINE 2 VIEW(S)   Radiation exposure index: 0.6215 mGy.   COMPARISON:  October 28, 2021.   FINDINGS: Two intraoperative fluoroscopic images were obtained of the lumbar spine. These demonstrate surgical hardware to proximally the T9 level.   IMPRESSION: Fluoroscopic guidance provided during lower thoracic surgery.     Electronically Signed   By: Marijo Conception M.D.   On: 03/06/2022 14:34  PATIENT SURVEYS:  FOTO initial 54/ goal 62  SCREENING FOR RED FLAGS: Bowel or bladder incontinence: No Spinal tumors: No Cauda equina syndrome: No Compression fracture: No Abdominal aneurysm: No  COGNITION: Overall cognitive status: Within functional limits for tasks assessed     SENSATION: WFL  MUSCLE LENGTH: Hamstrings: Right 72 deg; Left 75 deg  POSTURE: slight rounded sh. Posture/ pain with flexed posture  PALPATION: (+) tenderness around surgical incision/ minimal swelling noted.    LUMBAR ROM:   AROM  eval  Flexion 25% limited  Extension 75% limited  Right lateral flexion WFL  Left lateral flexion WFL  Right rotation 25% limited  Left rotation 25% limited   (Blank rows = not tested)  LOWER EXTREMITY ROM:      Cervical AROM WFL  B shoulder AROM WNL  B LE AROM WNL    LOWER EXTREMITY MMT:    B UE strength grossly 5/5 MMT except L shoulder flexion 4/5 MMT, R shoulder flexion 4+/5 MMT.   Grip strength: L 102#/ R  88#  THORACIC/ LUMBAR SPECIAL TESTS:  N/A  FUNCTIONAL TESTS:  TBD  GAIT: Distance walked: in clinic Assistive device utilized: None Level of assistance: Complete Independence Comments: no issues  TODAY'S TREATMENT:                                                                                                                              DATE: 05/04/22  Subjective:  Pt. Reports good compliance with HEP.  Pt. Reports continued discomfort/ pain with thoracic/lumbar extension.     Therex.:  B UBE 3 min. F/b (warm-up/ discussed symptoms).    Standing lumbar AROM (all planes)- limited extension.    Seated/supine ex. On blue theraball:  thoracic/lumbar extension, seated with upright posture, GTB scap. Retraction/ tricep extension 20x.  Pharmacist, hospital.    Reviewed HEP  Manual tx.:  Prone STM to thoracic paraspinals manually and with use of Hypervolt.  Mid-thoracic grade II PA mobs. 2x20 sec.     PATIENT EDUCATION:  Education details: Access Code: [email or web address] Person educated: Patient Education method: Explanation, Demonstration, and Handouts Education comprehension: verbalized understanding and returned demonstration  HOME EXERCISE PROGRAM: Access Code: [email or web address]  URL: [email or web address] Date: 04/25/2022 Prepared by: Dorcas Carrow Exercises - Seated Thoracic Lumbar Extension with Pectoralis Stretch - 1 x daily - 7 x weekly - 3 sets - 10 reps - 5-10 sec hold - Standing Lumbar Spine Flexion Stretch Counter - 1 x daily - 7 x weekly - 3 sets - 10 reps - 5-10 sec hold - Prone Scapular Retraction Y - 1 x daily - 3 x weekly - 3 sets - 10 reps - Prone W Scapular Retraction - 1 x daily - 3 x weekly - 3 sets - 10 reps - Supine Transversus Abdominis Bracing with Heel Slide - 1 x daily - 7 x weekly - 3 sets - 10 reps   ASSESSMENT:  CLINICAL IMPRESSION: Pt. Presents able to complete supine thoracic/lumbar extension on blue ball.  Pt. Limited by generalized muscle fatigue/  shoulder flexion weakness.  Increase muscle tenderness/ sensitivity with use of Hypervolt in prone position.   Pt. Will benefit from skilled PT services to increase generalized strengthening/ pain mgmt. To promote return to work with no pain/limitations.    OBJECTIVE IMPAIRMENTS: decreased activity tolerance, decreased balance, decreased mobility, decreased ROM, decreased strength, hypomobility, impaired flexibility, impaired sensation, improper body mechanics, postural dysfunction, and pain.   ACTIVITY LIMITATIONS: carrying, lifting,  bending, standing, reach over head, locomotion level, and caring for others  PARTICIPATION LIMITATIONS: occupation and yard work  PERSONAL FACTORS: Pension scheme manager are also affecting patient's functional outcome.   REHAB POTENTIAL: Good  CLINICAL DECISION MAKING: Stable/uncomplicated  EVALUATION COMPLEXITY: Low   GOALS: Goals reviewed with patient? Yes  SHORT TERM GOALS: Target date: 05/09/22  Pt. Independent with HEP to increase B shoulder strength 1/2 muscle grade to improve pain-free mobility/ lifting.  Baseline: see above Goal status: INITIAL   LONG TERM GOALS: Target date: 05/23/22  Pt. Will increase FOTO to 62 to improve pain-free mobility.   Baseline: initial 54 Goal status: INITIAL  2.  Pt. Will demonstrate proper lifting technique/ upright posture with no increase c/o pain.   Baseline: pain limited with prolonged standing.  Goal status: INITIAL  3.  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   Baseline: pt. Currently not working Goal status: INITIAL  PLAN:  PT FREQUENCY: 2x/week  PT DURATION: 4 weeks  PLANNED INTERVENTIONS: Therapeutic exercises, Therapeutic activity, Neuromuscular re-education, Patient/Family education, Self Care, Joint mobilization, Dry Needling, Cryotherapy, Moist heat, scar mobilization, and Manual therapy.  PLAN FOR NEXT SESSION: Progress HEP  Pura Spice, PT, DPT # [phone] 05/06/2022, 5:48 PM

## 2022-05-06 NOTE — Therapy (Signed)
OUTPATIENT PHYSICAL THERAPY THORACOLUMBAR TREATMENT   Patient Name: Darrell Kelley MRN: [id number] DOB:[birth year], 57 y.o., male Today's Date: 05/01/2022  END OF SESSION:  PT End of Session - 05/06/22 1348     Visit Number 2    Number of Visits 9    Date for PT Re-Evaluation 05/23/22    PT Start Time 1343    PT Stop Time 1432    PT Time Calculation (min) 49 min    Activity Tolerance Patient limited by pain    Behavior During Therapy [facility] for tasks assessed/performed             Past Medical History:  Diagnosis Date   Anxiety    Depression    History of kidney stones    Hyperlipidemia    Wears hearing aid in both ears    Past Surgical History:  Procedure Laterality Date   CHOLECYSTECTOMY  2008   COLONOSCOPY WITH PROPOFOL N/A 07/28/2019   Procedure: COLONOSCOPY WITH BIOPSY;  Surgeon: Lucilla Lame, MD;  Location: [place];  Service: Endoscopy;  Laterality: N/A;   LAMINECTOMY N/A 03/06/2022   Procedure: OPEN T8-9 LAMINECTOMY FOR CYST FENESTRATION;  Surgeon: Meade Maw, MD;  Location: [facility];  Service: Neurosurgery;  Laterality: N/A;   TONSILLECTOMY AND ADENOIDECTOMY  1975   Patient Active Problem List   Diagnosis Date Noted   Intradural arachnoid cyst of spine 03/06/2022   Thoracic radiculitis 03/06/2022   Arachnoid cyst 12/13/2021   Special screening for malignant neoplasms, colon     PCP: Sofie Hartigan, MD  REFERRING PROVIDER: Meade Maw, MD  REFERRING DIAG: G93.0 (ICD-10-CM) - Arachnoid cyst   Rationale for Evaluation and Treatment: Rehabilitation  THERAPY DIAG:  Pain in thoracic spine  Joint stiffness  Muscle weakness (generalized)  Radiculopathy of thoracic region  ONSET DATE: 03/06/22 (surgery date)  SUBJECTIVE:                                                                                                                                                                                           SUBJECTIVE  STATEMENT:  EVALUATION Pt. S/p thoracic laminectomy at T8-9 on 03/06/22 and reports 4/10 back pain currently.  No radicular symptoms reported.  R hand dominant.  Pt. Has not returned to work at this time and needs to be 100% to return.  Pt. Enjoys shooting at the range.  Sidesleeping at night with proper posture.    PERTINENT HISTORY:  DOS: 03/06/22 Thoracic laminectomy at T8-9 for fenestration of intradural arachnoid cyst   HISTORY OF PRESENT ILLNESS: Darrell Kelley is status post thoracic laminectomy at T8-9 for fenestration of  intradural arachnoid cyst .    He is taking tramadol on occasion.  He is taking methocarbamol and meloxicam.  This is helping.  His rib cage related pain is better.  He is having some pain in between his shoulder blades.   PHYSICAL EXAMINATION:  General: Patient is well developed, well nourished, calm, collected, and in no apparent distress.     NEUROLOGICAL:  General: In no acute distress.   Awake, alert, oriented to person, place, and time.  Pupils equal round and reactive to light.  Facial tone is symmetric.       Strength: Side Biceps Triceps Deltoid Interossei Grip Wrist Ext. Wrist Flex.  R '5 5 5 5 5 5 5  '$ L '5 5 5 5 5 5 5                                                                                                   '$ Side Iliopsoas Quads Hamstring PF DF EHL  R '5 5 5 5 5 5  '$ L '5 5 5 5 5 5    '$ Incision c/d/i     ROS (Neurologic):  Negative except as noted above   IMAGING: Nothing new to review.   ASSESSMENT/PLAN:  Darrell Kelley is doing well s/p above surgery.    We will start physical therapy for him.  He will continue his current medication regimen.  He is not ready to return to work.  Will review his work status at his next appointment.   Meade Maw MD Department of neurosurgery  PAIN:  Are you having pain? Yes: NPRS scale: 4/10 Pain location: T8-9 surgical site/ incision Pain description: aching/ sharp Aggravating  factors: increase activity Relieving factors: rest  PRECAUTIONS: None  WEIGHT BEARING RESTRICTIONS: No  FALLS:  Has patient fallen in last 6 months? 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    NEXT MD VISIT: 05/30/22  OBJECTIVE:   DIAGNOSTIC FINDINGS:  CLINICAL DATA:  Thoracic laminectomy for cyst.   EXAM: OPERATIVE THORACIC SPINE 2 VIEW(S)   Radiation exposure index: 0.6215 mGy.   COMPARISON:  October 28, 2021.   FINDINGS: Two intraoperative fluoroscopic images were obtained of the lumbar spine. These demonstrate surgical hardware to proximally the T9 level.   IMPRESSION: Fluoroscopic guidance provided during lower thoracic surgery.     Electronically Signed   By: Marijo Conception M.D.   On: 03/06/2022 14:34  PATIENT SURVEYS:  FOTO initial 54/ goal 62  SCREENING FOR RED FLAGS: Bowel or bladder incontinence: No Spinal tumors: No Cauda equina syndrome: No Compression fracture: No Abdominal aneurysm: No  COGNITION: Overall cognitive status: Within functional limits for tasks assessed     SENSATION: WFL  MUSCLE LENGTH: Hamstrings: Right 72 deg; Left 75 deg  POSTURE: slight rounded sh. Posture/ pain with flexed posture  PALPATION: (+) tenderness around surgical incision/ minimal swelling noted.    LUMBAR ROM:  AROM eval  Flexion 25% limited  Extension 75% limited  Right lateral flexion WFL  Left lateral flexion WFL  Right rotation 25% limited  Left rotation 25% limited   (Blank rows = not tested)  LOWER EXTREMITY ROM:      Cervical AROM WFL  B shoulder AROM WNL  B LE AROM WNL    LOWER EXTREMITY MMT:    B UE strength grossly 5/5 MMT except L shoulder flexion 4/5 MMT, R shoulder flexion 4+/5 MMT.   Grip strength: L 102#/ R  88#  THORACIC/ LUMBAR SPECIAL TESTS:  N/A  FUNCTIONAL TESTS:  TBD  GAIT: Distance walked: in clinic Assistive device utilized: None Level of assistance: Complete Independence Comments: no issues  TODAY'S TREATMENT:                                                                                                                              DATE: 05/01/22  Subjective:  Pt. C/o 6/10 mid-back pain prior to tx. Session.  Pt. States he was pain limited with any extension based ex.  Pt. Compliant with rest of HEP.   Therex.:  B UBE 3 min. F/b (warm-up/ discussed HEP and symptoms).    Standing lumbar AROM (all planes)- limited extension.    Standing at mirror: [place]. Retraction/ diagonals/ scap. Retraction 20x each.    Reviewed HEP  Manual tx.:  Prone STM to thoracic paraspinals/ reassessment of incision/ scar massage.      PATIENT EDUCATION:  Education details: Access Code: [email or web address] Person educated: Patient Education method: Explanation, Demonstration, and Handouts Education comprehension: verbalized understanding and returned demonstration  HOME EXERCISE PROGRAM: Access Code: [email or web address]  URL: [email or web address] Date: 04/25/2022 Prepared by: Dorcas Carrow Exercises - Seated Thoracic Lumbar Extension with Pectoralis Stretch - 1 x daily - 7 x weekly - 3 sets - 10 reps - 5-10 sec hold - Standing Lumbar Spine Flexion Stretch Counter - 1 x daily - 7 x weekly - 3 sets - 10 reps - 5-10 sec hold - Prone Scapular Retraction Y - 1 x daily - 3 x weekly - 3 sets - 10 reps - Prone W Scapular Retraction - 1 x daily - 3 x weekly - 3 sets - 10 reps - Supine Transversus Abdominis Bracing with Heel Slide - 1 x daily - 7 x weekly - 3 sets - 10 reps   ASSESSMENT:  CLINICAL IMPRESSION: Pt. Presents with marked increase in mid-back pain during any standing extension ex.  PT modified HEP to avoid thoracic extension in seated posture at this time.  Excellent incision healing with  tenderness noted.   Pt. Limited by generalized muscle fatigue/ shoulder flexion weakness. Good understanding of HEP.   Pt. Will benefit from skilled PT services to increase generalized strengthening/ pain mgmt. To promote return to work with no pain/limitations.    OBJECTIVE IMPAIRMENTS: decreased activity tolerance, decreased balance, decreased mobility, decreased ROM, decreased strength, hypomobility, impaired flexibility, impaired sensation, improper  body mechanics, postural dysfunction, and pain.   ACTIVITY LIMITATIONS: carrying, lifting, bending, standing, reach over head, locomotion level, and caring for others  PARTICIPATION LIMITATIONS: occupation and yard work  PERSONAL FACTORS: Pension scheme manager are also affecting patient's functional outcome.   REHAB POTENTIAL: Good  CLINICAL DECISION MAKING: Stable/uncomplicated  EVALUATION COMPLEXITY: Low   GOALS: Goals reviewed with patient? Yes  SHORT TERM GOALS: Target date: 05/09/22  Pt. Independent with HEP to increase B shoulder strength 1/2 muscle grade to improve pain-free mobility/ lifting.  Baseline: see above Goal status: INITIAL   LONG TERM GOALS: Target date: 05/23/22  Pt. Will increase FOTO to 62 to improve pain-free mobility.   Baseline: initial 54 Goal status: INITIAL  2.  Pt. Will demonstrate proper lifting technique/ upright posture with no increase c/o pain.   Baseline: pain limited with prolonged standing.  Goal status: INITIAL  3.  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   Baseline: pt. Currently not working Goal status: INITIAL  PLAN:  PT FREQUENCY: 2x/week  PT DURATION: 4 weeks  PLANNED INTERVENTIONS: Therapeutic exercises, Therapeutic activity, Neuromuscular re-education, Patient/Family education, Self Care, Joint mobilization, Dry Needling, Cryotherapy, Moist heat, scar mobilization, and Manual therapy.  PLAN FOR NEXT SESSION: Progress  HEP  Pura Spice, PT, DPT # [phone] 05/06/2022, 2:00 PM

## 2022-05-08 ENCOUNTER — Ambulatory Visit: Payer: Commercial Managed Care - PPO | Admitting: Physical Therapy

## 2022-05-08 DIAGNOSIS — M2560 Stiffness of unspecified joint, not elsewhere classified: Secondary | ICD-10-CM

## 2022-05-08 DIAGNOSIS — M5414 Radiculopathy, thoracic region: Secondary | ICD-10-CM | POA: Diagnosis not present

## 2022-05-08 DIAGNOSIS — M6281 Muscle weakness (generalized): Secondary | ICD-10-CM

## 2022-05-08 DIAGNOSIS — G930 Cerebral cysts: Secondary | ICD-10-CM | POA: Diagnosis not present

## 2022-05-08 DIAGNOSIS — M546 Pain in thoracic spine: Secondary | ICD-10-CM | POA: Diagnosis not present

## 2022-05-08 NOTE — Therapy (Incomplete)
OUTPATIENT PHYSICAL THERAPY THORACOLUMBAR TREATMENT  Patient Name: Darrell Kelley MRN: [id number] DOB:08-10-1965, 57 y.o., male Today's Date: 05/08/2022   END OF SESSION:  PT End of Session - 05/08/22 1324     Visit Number 4    Number of Visits 9    Date for PT Re-Evaluation 05/23/22    PT Start Time 1324    Activity Tolerance Patient limited by pain    Behavior During Therapy [facility] for tasks assessed/performed            1324 to 1410  (46 minutes).    Past Medical History:  Diagnosis Date   Anxiety    Depression    History of kidney stones    Hyperlipidemia    Wears hearing aid in both ears    Past Surgical History:  Procedure Laterality Date   CHOLECYSTECTOMY  2008   COLONOSCOPY WITH PROPOFOL N/A 07/28/2019   Procedure: COLONOSCOPY WITH BIOPSY;  Surgeon: Lucilla Lame, MD;  Location: [place];  Service: Endoscopy;  Laterality: N/A;   LAMINECTOMY N/A 03/06/2022   Procedure: OPEN T8-9 LAMINECTOMY FOR CYST FENESTRATION;  Surgeon: Meade Maw, MD;  Location: [facility];  Service: Neurosurgery;  Laterality: N/A;   TONSILLECTOMY AND ADENOIDECTOMY  1975   Patient Active Problem List   Diagnosis Date Noted   Intradural arachnoid cyst of spine 03/06/2022   Thoracic radiculitis 03/06/2022   Arachnoid cyst 12/13/2021   Special screening for malignant neoplasms, colon     PCP: Sofie Hartigan, MD  REFERRING PROVIDER: Meade Maw, MD  REFERRING DIAG: G93.0 (ICD-10-CM) - Arachnoid cyst   Rationale for Evaluation and Treatment: Rehabilitation  THERAPY DIAG:  Pain in thoracic spine  Joint stiffness  Muscle weakness (generalized)  Radiculopathy of thoracic region  ONSET DATE: 03/06/22 (surgery date)  SUBJECTIVE:                                                                                                                                                                                           SUBJECTIVE STATEMENT:  EVALUATION Pt.  S/p thoracic laminectomy at T8-9 on 03/06/22 and reports 4/10 back pain currently.  No radicular symptoms reported.  R hand dominant.  Pt. Has not returned to work at this time and needs to be 100% to return.  Pt. Enjoys shooting at the range.  Sidesleeping at night with proper posture.    PERTINENT HISTORY:  DOS: 03/06/22 Thoracic laminectomy at T8-9 for fenestration of intradural arachnoid cyst   HISTORY OF PRESENT ILLNESS: Darrell Kelley is status post thoracic laminectomy at T8-9 for fenestration of intradural arachnoid cyst .    He  is taking tramadol on occasion.  He is taking methocarbamol and meloxicam.  This is helping.  His rib cage related pain is better.  He is having some pain in between his shoulder blades.   PHYSICAL EXAMINATION:  General: Patient is well developed, well nourished, calm, collected, and in no apparent distress.     NEUROLOGICAL:  General: In no acute distress.   Awake, alert, oriented to person, place, and time.  Pupils equal round and reactive to light.  Facial tone is symmetric.       Strength: Side Biceps Triceps Deltoid Interossei Grip Wrist Ext. Wrist Flex.  R '5 5 5 5 5 5 5  '$ L '5 5 5 5 5 5 5                                                                                                   '$ Side Iliopsoas Quads Hamstring PF DF EHL  R '5 5 5 5 5 5  '$ L '5 5 5 5 5 5    '$ Incision c/d/i     ROS (Neurologic):  Negative except as noted above   IMAGING: Nothing new to review.   ASSESSMENT/PLAN:  Darrell Kelley is doing well s/p above surgery.    We will start physical therapy for him.  He will continue his current medication regimen.  He is not ready to return to work.  Will review his work status at his next appointment.   Meade Maw MD Department of neurosurgery  PAIN:  Are you having pain? Yes: NPRS scale: 4/10 Pain location: T8-9 surgical site/ incision Pain description: aching/ sharp Aggravating factors: increase  activity Relieving factors: rest  PRECAUTIONS: None  WEIGHT BEARING RESTRICTIONS: No  FALLS:  Has patient fallen in last 6 months? No  LIVING ENVIRONMENT: Lives with: lives with their spouse Lives in: House/apartment Has following equipment at home: None  OCCUPATION: Therapist, sports at [employer]: Return to work without limitations/ pain.    NEXT MD VISIT: 05/30/22  OBJECTIVE:   DIAGNOSTIC FINDINGS:  CLINICAL DATA:  Thoracic laminectomy for cyst.   EXAM: OPERATIVE THORACIC SPINE 2 VIEW(S)   Radiation exposure index: 0.6215 mGy.   COMPARISON:  October 28, 2021.   FINDINGS: Two intraoperative fluoroscopic images were obtained of the lumbar spine. These demonstrate surgical hardware to proximally the T9 level.   IMPRESSION: Fluoroscopic guidance provided during lower thoracic surgery.     Electronically Signed   By: Marijo Conception M.D.   On: 03/06/2022 14:34  PATIENT SURVEYS:  FOTO initial 54/ goal 62  SCREENING FOR RED FLAGS: Bowel or bladder incontinence: No Spinal tumors: No Cauda equina syndrome: No Compression fracture: No Abdominal aneurysm: No  COGNITION: Overall cognitive status: Within functional limits for tasks assessed     SENSATION: WFL  MUSCLE LENGTH: Hamstrings: Right 72 deg; Left 75 deg  POSTURE: slight rounded sh. Posture/ pain with flexed posture  PALPATION: (+) tenderness around surgical incision/ minimal swelling noted.    LUMBAR ROM:   AROM eval  Flexion 25% limited  Extension  75% limited  Right lateral flexion WFL  Left lateral flexion WFL  Right rotation 25% limited  Left rotation 25% limited   (Blank rows = not tested)  LOWER EXTREMITY ROM:      Cervical AROM WFL  B shoulder AROM WNL  B LE AROM WNL    LOWER EXTREMITY MMT:    B UE strength grossly 5/5 MMT except L shoulder flexion 4/5 MMT, R shoulder flexion 4+/5 MMT.   Grip strength: L 102#/ R 88#  THORACIC/  LUMBAR SPECIAL TESTS:  N/A  FUNCTIONAL TESTS:  TBD  GAIT: Distance walked: in clinic Assistive device utilized: None Level of assistance: Complete Independence Comments: no issues  TODAY'S TREATMENT:                                                                                                                              DATE: 05/08/22  Subjective:  Pt. Reports persistent pain in mid-thoracic region after last tx. Session/ over weekend.  Pt. Remains pain limited thoracic/ lumbar extension.  PT will send MD progress note discussing pts. high levels of pain/ lack of progress with conservative treatment.    Therex.:  Discussed HEP/ daily activity  Manual tx.:  Seated/ prone STM to thoracic paraspinals manually (no Hypervolt today).  Mid-thoracic grade II PA mobs. 2x20 sec. (Above/below surgical site).  Scar massage with reassessment of mobility.  No adhesions or swelling noted in area of incision.    Seated thoracic rotn. On mat table (pain tolerable).    Quadruped cat-cow/ childs pose added to HEP.  Pain tolerated to increase thoracic flexion/ extension.   Marked increase in pain with thoracic/ lumbar extension.     PATIENT EDUCATION:  Education details: Access Code: [email or web address] Person educated: Patient Education method: Explanation, Demonstration, and Handouts Education comprehension: verbalized understanding and returned demonstration  HOME EXERCISE PROGRAM: Access Code: [email or web address]  URL: [email or web address] Date: 04/25/2022 Prepared by: Dorcas Carrow Exercises - Seated Thoracic Lumbar Extension with Pectoralis Stretch - 1 x daily - 7 x weekly - 3 sets - 10 reps - 5-10 sec hold - Standing Lumbar Spine Flexion Stretch Counter - 1 x daily - 7 x weekly - 3 sets - 10 reps - 5-10 sec hold - Prone Scapular Retraction Y - 1 x daily - 3 x weekly - 3 sets - 10 reps - Prone W Scapular Retraction - 1 x daily - 3 x weekly - 3 sets - 10 reps - Supine Transversus Abdominis  Bracing with Heel Slide - 1 x daily - 7 x weekly - 3 sets - 10 reps   Access Code: [email or web address] URL: [email or web address] Date: 05/08/2022 Prepared by: Dorcas Carrow  Exercises - Cat Cow  - 2 x daily - 7 x weekly - 1 sets - 5 reps - Child's Pose Stretch  - 2 x daily - 7 x weekly - 1 sets - 5 reps  ASSESSMENT:  CLINICAL IMPRESSION: Pt. Remains limited with treatment progression/ strength  training ex. Due to persistent mid-thoracic pain.  Increase muscle tenderness/ sensitivity with STM in prone/ seated position.  PT send MD progress note due to concerns of persistent back pain 2 months after surgery.  Good incision healing/ no swelling noted.   Pt. Will benefit from skilled PT services to increase generalized strengthening/ pain mgmt. To promote return to work with no pain/limitations.    OBJECTIVE IMPAIRMENTS: decreased activity tolerance, decreased balance, decreased mobility, decreased ROM, decreased strength, hypomobility, impaired flexibility, impaired sensation, improper body mechanics, postural dysfunction, and pain.   ACTIVITY LIMITATIONS: carrying, lifting, bending, standing, reach over head, locomotion level, and caring for others  PARTICIPATION LIMITATIONS: occupation and yard work  PERSONAL FACTORS: Pension scheme manager are also affecting patient's functional outcome.   REHAB POTENTIAL: Good  CLINICAL DECISION MAKING: Stable/uncomplicated  EVALUATION COMPLEXITY: Low   GOALS: Goals reviewed with patient? Yes  SHORT TERM GOALS: Target date: 05/09/22  Pt. Independent with HEP to increase B shoulder strength 1/2 muscle grade to improve pain-free mobility/ lifting.  Baseline: see above Goal status: INITIAL   LONG TERM GOALS: Target date: 05/23/22  Pt. Will increase FOTO to 62 to improve pain-free mobility.   Baseline: initial 54 Goal status: INITIAL  2.  Pt. Will demonstrate proper lifting technique/ upright posture with no increase c/o pain.    Baseline: pain limited with prolonged standing.  Goal status: INITIAL  3.  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   Baseline: pt. Currently not working Goal status: INITIAL  PLAN:  PT FREQUENCY: 2x/week  PT DURATION: 4 weeks  PLANNED INTERVENTIONS: Therapeutic exercises, Therapeutic activity, Neuromuscular re-education, Patient/Family education, Self Care, Joint mobilization, Dry Needling, Cryotherapy, Moist heat, scar mobilization, and Manual therapy.  PLAN FOR NEXT SESSION: Progress HEP/ Check goals.   Pura Spice, PT, DPT # [id number] 05/08/2022, 1:24 PM

## 2022-05-09 DIAGNOSIS — G930 Cerebral cysts: Secondary | ICD-10-CM

## 2022-05-09 DIAGNOSIS — Z9889 Other specified postprocedural states: Secondary | ICD-10-CM

## 2022-05-10 MED ORDER — PREGABALIN 75 MG PO CAPS: 75.0000 mg | ORAL_CAPSULE | Freq: Two times a day (BID) | ORAL | 1 refills | Status: DC

## 2022-05-10 NOTE — Addendum Note (Signed)
Addended byGeronimo Boot on: 05/10/2022 11:16 AM   Modules accepted: Orders

## 2022-05-10 NOTE — Telephone Encounter (Signed)
Will start on lyrica 75 mg bid. Reviewed side effects with him. PMP reviewed and is appropriate.

## 2022-05-11 ENCOUNTER — Encounter: Payer: Commercial Managed Care - PPO | Admitting: Physical Therapy

## 2022-05-11 NOTE — Telephone Encounter (Signed)
He was supposed to go back to work in 20 or so days. This needs pushed out another 6 weeks. Can you change this please?

## 2022-05-12 NOTE — Telephone Encounter (Signed)
Can you put in a work note to extend his disability please by 6 weeks. I can then fax that to [employer]. I had him out for 8 weeks from date of surgery returning on 05/01/2022.

## 2022-05-15 ENCOUNTER — Encounter: Payer: Commercial Managed Care - PPO | Admitting: Physical Therapy

## 2022-05-18 ENCOUNTER — Encounter: Payer: Commercial Managed Care - PPO | Admitting: Physical Therapy

## 2022-05-22 ENCOUNTER — Encounter: Payer: Commercial Managed Care - PPO | Admitting: Physical Therapy

## 2022-05-24 ENCOUNTER — Encounter: Payer: Commercial Managed Care - PPO | Admitting: Physical Therapy

## 2022-05-30 ENCOUNTER — Encounter: Payer: Self-pay | Admitting: Neurosurgery

## 2022-05-30 ENCOUNTER — Ambulatory Visit (INDEPENDENT_AMBULATORY_CARE_PROVIDER_SITE_OTHER): Payer: Commercial Managed Care - PPO | Admitting: Neurosurgery

## 2022-05-30 VITALS — BP 139/88 | HR 102 | Temp 98.0°F | Wt 231.6 lb

## 2022-05-30 DIAGNOSIS — G930 Cerebral cysts: Secondary | ICD-10-CM

## 2022-05-30 DIAGNOSIS — Z09 Encounter for follow-up examination after completed treatment for conditions other than malignant neoplasm: Secondary | ICD-10-CM

## 2022-05-30 MED ORDER — PREGABALIN 25 MG PO CAPS: 25.0000 mg | ORAL_CAPSULE | Freq: Two times a day (BID) | ORAL | 0 refills | Status: DC

## 2022-05-30 MED ORDER — MELOXICAM 7.5 MG PO TABS: 7.5000 mg | ORAL_TABLET | Freq: Two times a day (BID) | ORAL | 0 refills | Status: DC

## 2022-05-30 NOTE — Progress Notes (Signed)
   REFERRING PHYSICIAN:  Sofie Hartigan, Md [address] [place] [place],  [place] [address]  DOS: 03/06/22 Thoracic laminectomy at T8-9 for fenestration of intradural arachnoid cyst   HISTORY OF PRESENT ILLNESS: Darrell Kelley is status post thoracic laminectomy at T8-9 for fenestration of intradural arachnoid cyst.   He continues to have pain in his mid back.  He works as an Scientist, research (life sciences), which requires lifting heavy items.  He is not ready to return to this.   PHYSICAL EXAMINATION:  General: Patient is well developed, well nourished, calm, collected, and in no apparent distress.   NEUROLOGICAL:  General: In no acute distress.   Awake, alert, oriented to person, place, and time.  Pupils equal round and reactive to light.  Facial tone is symmetric.     Strength: Moves upper extremities well     Side Iliopsoas Quads Hamstring PF DF EHL  R 5 5 5 5 5 5  $ L 5 5 5 5 5 5   $ Incision c/d/i   ROS (Neurologic):  Negative except as noted above  IMAGING: Nothing new to review.   ASSESSMENT/PLAN:  Darrell Kelley is doing well s/p above surgery.  Despite this, he has had an extended recovery where he still has pain in between his shoulder blades.  He is not ready to return to work.  I will see him back in 6 weeks.  I will reorder his meloxicam and Lyrica. Meade Maw MD Department of neurosurgery

## 2022-06-01 ENCOUNTER — Encounter: Payer: Commercial Managed Care - PPO | Admitting: Physical Therapy

## 2022-06-06 ENCOUNTER — Encounter: Payer: Commercial Managed Care - PPO | Admitting: Physical Therapy

## 2022-06-08 ENCOUNTER — Encounter: Payer: Commercial Managed Care - PPO | Admitting: Physical Therapy

## 2022-07-11 ENCOUNTER — Other Ambulatory Visit: Payer: Self-pay

## 2022-07-11 DIAGNOSIS — M5414 Radiculopathy, thoracic region: Secondary | ICD-10-CM

## 2022-07-11 MED ORDER — METHOCARBAMOL 500 MG PO TABS: 500.0000 mg | ORAL_TABLET | Freq: Four times a day (QID) | ORAL | 1 refills | Status: AC | PRN

## 2022-07-11 NOTE — Telephone Encounter (Signed)
Patient has requested a refill of Methocarbamol.   I have sent in a refill of this to [facility].

## 2022-07-18 ENCOUNTER — Encounter: Payer: Self-pay | Admitting: Neurosurgery

## 2022-07-18 ENCOUNTER — Ambulatory Visit: Payer: Commercial Managed Care - PPO | Admitting: Neurosurgery

## 2022-07-18 VITALS — BP 132/82 | Ht 72.0 in | Wt 231.0 lb

## 2022-07-18 DIAGNOSIS — G8929 Other chronic pain: Secondary | ICD-10-CM | POA: Diagnosis not present

## 2022-07-18 DIAGNOSIS — M546 Pain in thoracic spine: Secondary | ICD-10-CM

## 2022-07-18 NOTE — Progress Notes (Signed)
   REFERRING PHYSICIAN:  Venetia Night, Md [address] [place],  [place] [address]  DOS: 03/06/22 Thoracic laminectomy at T8-9 for fenestration of intradural arachnoid cyst   HISTORY OF PRESENT ILLNESS: Darrell Kelley is status post thoracic laminectomy at T8-9 for fenestration of intradural arachnoid cyst.   He continues to have pain in his mid back.  He is taking meloxicam twice a day which is helping him.  He will return to the operating room this Thursday.  PHYSICAL EXAMINATION:  General: Patient is well developed, well nourished, calm, collected, and in no apparent distress.   NEUROLOGICAL:  General: In no acute distress.   Awake, alert, oriented to person, place, and time.  Pupils equal round and reactive to light.  Facial tone is symmetric.     Strength: Moves upper extremities well     Side Iliopsoas Quads Hamstring PF DF EHL  R 5 5 5 5 5 5   L 5 5 5 5 5 5    Incision c/d/i   ROS (Neurologic):  Negative except as noted above  IMAGING: Nothing new to review.   ASSESSMENT/PLAN:  Darrell Kelley is doing well s/p above surgery.  He has had slow improvement over time.  He is now managing his pain with meloxicam and muscle relaxants on occasion.  He is also using Tylenol on occasion.  He will return to work on Thursday.  I will see him back on an as-needed basis.     Venetia Night MD Department of neurosurgery

## 2022-07-27 DIAGNOSIS — G8929 Other chronic pain: Secondary | ICD-10-CM

## 2022-07-27 MED ORDER — TRAMADOL HCL 50 MG PO TABS: 50.0000 mg | ORAL_TABLET | Freq: Four times a day (QID) | ORAL | 0 refills | Status: DC | PRN

## 2022-07-27 NOTE — Telephone Encounter (Signed)
Ultram sent to pharmacy 

## 2022-07-27 NOTE — Addendum Note (Signed)
Addended byDrake Leach on: 07/27/2022 09:20 PM   Modules accepted: Orders

## 2022-07-27 NOTE — Telephone Encounter (Signed)
Reviewed with Myer Haff. Can do one last script of ultram and then no more narcotics. Message sent to patient.   PMP reviewed and is appropriate.

## 2022-08-07 DIAGNOSIS — Z0000 Encounter for general adult medical examination without abnormal findings: Secondary | ICD-10-CM | POA: Diagnosis not present

## 2022-08-07 DIAGNOSIS — N200 Calculus of kidney: Secondary | ICD-10-CM | POA: Diagnosis not present

## 2022-08-07 DIAGNOSIS — E785 Hyperlipidemia, unspecified: Secondary | ICD-10-CM | POA: Diagnosis not present

## 2022-08-07 DIAGNOSIS — J309 Allergic rhinitis, unspecified: Secondary | ICD-10-CM | POA: Diagnosis not present

## 2022-08-07 DIAGNOSIS — E291 Testicular hypofunction: Secondary | ICD-10-CM | POA: Diagnosis not present

## 2022-08-07 DIAGNOSIS — Z125 Encounter for screening for malignant neoplasm of prostate: Secondary | ICD-10-CM | POA: Diagnosis not present

## 2022-08-23 DIAGNOSIS — Z125 Encounter for screening for malignant neoplasm of prostate: Secondary | ICD-10-CM | POA: Diagnosis not present

## 2022-08-23 DIAGNOSIS — Z0000 Encounter for general adult medical examination without abnormal findings: Secondary | ICD-10-CM | POA: Diagnosis not present

## 2022-08-30 NOTE — Telephone Encounter (Signed)
Please see if he can be seen before the end of the month. If not, put him on cancellation list.

## 2022-09-01 NOTE — Progress Notes (Unsigned)
   REFERRING PHYSICIAN:  Marina Goodell, Md [address] [place],  [place] [address]  DOS: 03/06/22 Thoracic laminectomy at T8-9 for fenestration of intradural arachnoid cyst   HISTORY OF PRESENT ILLNESS: Darrell Kelley is status post thoracic laminectomy at T8-9 for fenestration of intradural arachnoid cyst.   He was last seen by Dr. Myer Haff on 07/18/22 and he continued with mid back pain. He was released to follow up as needed.   He sent a message last week stating he continues with back spasms and pain that caused him to stop working.   He is here for follow up.   He continues pain and spasms in his mid back. 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 He has some improvement with laying down. 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 Pain is at the area of his incision.   He stopped lyrica previously due to side effects (he was on 75mg ). He has recently restarted lyrica 25 mg bid-tid. He continues on mobic and prn robaxin. Has not taken much ultram lately.   No issues with hand dexterity. No balance issues.    PHYSICAL EXAMINATION:  General: Patient is well developed, well nourished, calm, collected, and in no apparent distress.   NEUROLOGICAL:  General: In no acute distress.   Awake, alert, oriented to person, place, and time.  Pupils equal round and reactive to light.  Facial tone is symmetric.     Strength: Side Biceps Triceps Deltoid Interossei Grip Wrist Ext. Wrist Flex.  R 5 5 5 5 5 5 5   L 5 5 5 5 5 5 5            Side Iliopsoas Quads Hamstring PF DF EHL  R 5 5 5 5 5 5   L 5 5 5 5 5 5    Incision well healed. No tenderness.   Reflexes are 1+ and symmetric at the biceps, triceps, brachioradialis, patella and achilles.   Hoffman's is absent. No clonus.   Bilateral upper and lower extremity sensation is intact to light touch.     Gait is normal.   ROS (Neurologic):  Negative except as noted  above  IMAGING: Nothing new to review.   ASSESSMENT/PLAN:  Darrell Kelley continues with pain and spasms at the incision s/p above surgery.   No signs of myelopathy on exam. No balance issues or dexterity issues.   Treatment options reviewed with patient and following plan made:   - Xrays of thoracic spine ordered.  - Will xrays with Dr. Myer Haff and message him with further plan.  - Depending on xray results, may refer to pain management Pernell Dupre).  I spent a total of 20 minutes in face-to-face and non-face-to-face activities related to this patient's care today including review of outside records, review of imaging, review of symptoms, physical exam, discussion of differential diagnosis, discussion of treatment options, and documentation.   ADDENDUM 09/05/22:  Xrays of thoracic spine dated 09/05/22:  FINDINGS: No fracture or malalignment. Minimal degenerative disc disease. No other bony abnormalities.   IMPRESSION: Minimal degenerative disc disease.     Electronically Signed   By: Gerome Sam III M.D.   On: 09/05/2022 17:49  I have personally reviewed the images and agree with the above interpretation.   Xrays reviwed with Dr. Myer Haff. He recommends referral to pain management. Patient sent a message.   Drake Leach PA-C Department of neurosurgery

## 2022-09-05 ENCOUNTER — Encounter: Payer: Self-pay | Admitting: Orthopedic Surgery

## 2022-09-05 ENCOUNTER — Ambulatory Visit: Payer: Commercial Managed Care - PPO | Admitting: Orthopedic Surgery

## 2022-09-05 ENCOUNTER — Ambulatory Visit
Admission: RE | Admit: 2022-09-05 | Discharge: 2022-09-05 | Disposition: A | Discharge status: Home / Self Care | Payer: Commercial Managed Care - PPO | Attending: Orthopedic Surgery | Admitting: Orthopedic Surgery

## 2022-09-05 ENCOUNTER — Ambulatory Visit
Admission: RE | Admit: 2022-09-05 | Discharge: 2022-09-05 | Disposition: A | Discharge status: Home / Self Care | Payer: Commercial Managed Care - PPO | Source: Ambulatory Visit | Attending: Orthopedic Surgery | Admitting: Orthopedic Surgery

## 2022-09-05 VITALS — BP 121/70 | Ht 72.0 in | Wt 231.0 lb

## 2022-09-05 DIAGNOSIS — M546 Pain in thoracic spine: Secondary | ICD-10-CM | POA: Insufficient documentation

## 2022-09-05 DIAGNOSIS — Z9889 Other specified postprocedural states: Secondary | ICD-10-CM

## 2022-09-05 DIAGNOSIS — G8929 Other chronic pain: Secondary | ICD-10-CM

## 2022-09-05 DIAGNOSIS — M5134 Other intervertebral disc degeneration, thoracic region: Secondary | ICD-10-CM | POA: Diagnosis not present

## 2022-09-05 DIAGNOSIS — Z09 Encounter for follow-up examination after completed treatment for conditions other than malignant neoplasm: Secondary | ICD-10-CM

## 2022-09-06 NOTE — Addendum Note (Signed)
Addended byDrake Leach on: 09/06/2022 08:14 AM   Modules accepted: Orders

## 2022-09-06 NOTE — Addendum Note (Signed)
Addended byDrake Leach on: 09/06/2022 08:01 AM   Modules accepted: Orders

## 2022-09-26 ENCOUNTER — Encounter: Payer: Self-pay | Admitting: Anesthesiology

## 2022-09-26 ENCOUNTER — Ambulatory Visit: Payer: Self-pay | Attending: Anesthesiology | Admitting: Anesthesiology

## 2022-09-26 VITALS — BP 151/93 | HR 97 | Temp 97.4°F | Resp 16 | Ht 72.0 in | Wt 230.0 lb

## 2022-09-26 DIAGNOSIS — M5414 Radiculopathy, thoracic region: Secondary | ICD-10-CM | POA: Insufficient documentation

## 2022-09-26 DIAGNOSIS — G8929 Other chronic pain: Secondary | ICD-10-CM | POA: Insufficient documentation

## 2022-09-26 DIAGNOSIS — G930 Cerebral cysts: Secondary | ICD-10-CM | POA: Insufficient documentation

## 2022-09-26 DIAGNOSIS — M47817 Spondylosis without myelopathy or radiculopathy, lumbosacral region: Secondary | ICD-10-CM | POA: Insufficient documentation

## 2022-09-26 DIAGNOSIS — G894 Chronic pain syndrome: Secondary | ICD-10-CM | POA: Insufficient documentation

## 2022-09-26 DIAGNOSIS — M792 Neuralgia and neuritis, unspecified: Secondary | ICD-10-CM | POA: Insufficient documentation

## 2022-09-26 DIAGNOSIS — M5136 Other intervertebral disc degeneration, lumbar region: Secondary | ICD-10-CM | POA: Insufficient documentation

## 2022-09-26 DIAGNOSIS — R109 Unspecified abdominal pain: Secondary | ICD-10-CM | POA: Insufficient documentation

## 2022-09-26 MED ORDER — LIDOCAINE 5 % EX PTCH: 1.0000 | MEDICATED_PATCH | CUTANEOUS | 3 refills | Status: AC

## 2022-09-26 MED ORDER — LIDOCAINE 5 % EX PTCH: 1.0000 | MEDICATED_PATCH | CUTANEOUS | 3 refills | Status: DC

## 2022-09-26 NOTE — Progress Notes (Unsigned)
Safety precautions to be maintained throughout the outpatient stay will include: orient to surroundings, keep bed in low position, maintain call bell within reach at all times, provide assistance with transfer out of bed and ambulation.  

## 2022-09-28 NOTE — Progress Notes (Signed)
Subjective:  Patient ID: Darrell Kelley, male    DOB: Nov 19, 1965  Age: 57 y.o. MRN: [id number]  CC: Back Pain   Procedure: None  HPI Darrell Kelley presents for reevaluation.  Darrell Kelley has had a recent thoracic laminotomy with excision of cyst.  He has recovered nicely from that.  He feels that he still has some incisional pain in the thoracic area that bothers him periodically throughout the day.  He does have a good regimen that he is currently using with anti-inflammatories using Lyrica and Robaxin spaced throughout the day.  He has been taking tramadol occasionally for pain relief.  He has had problems fulfilling his work-related responsibilities secondary to the chronicity of pain.  The pain that he is experiencing in the thoracic scar region is a lancinating pain primarily focal to the site of the incision.  He denies any sensory or motor dysfunction to the upper or lower extremities.  He strive to stay active and is using massage to this area.  He also reports that he has tried a TENS unit and he uses this occasionally to help as well.  Otherwise he is in his usual state of health at this time.  No side effects with any of the medication he is using are noted.  Outpatient Medications Prior to Visit  Medication Sig Dispense Refill   acetaminophen (TYLENOL) 500 MG tablet Take 1,000 mg by mouth every 8 (eight) hours as needed.     albuterol (VENTOLIN HFA) 108 (90 Base) MCG/ACT inhaler Inhale 1 puff into the lungs every 4 (four) hours as needed.     EPINEPHrine 0.3 mg/0.3 mL IJ SOAJ injection Inject 0.3 mg into the muscle as needed for anaphylaxis.     escitalopram (LEXAPRO) 10 MG tablet Take 5-10 mg by mouth daily.     meloxicam (MOBIC) 7.5 MG tablet Take 1 tablet (7.5 mg total) by mouth 2 (two) times daily. 60 tablet 0   methocarbamol (ROBAXIN) 500 MG tablet Take 1 tablet (500 mg total) by mouth every 6 (six) hours as needed for muscle spasms. 120 tablet 1   pregabalin (LYRICA)  25 MG capsule Take 25 mg by mouth 2 (two) times daily. He takes bid to tid prn     sertraline (ZOLOFT) 100 MG tablet Take 200 mg by mouth daily.     traMADol (ULTRAM) 50 MG tablet Take 1 tablet (50 mg total) by mouth every 6 (six) hours as needed for severe pain. 20 tablet 0   No facility-administered medications prior to visit.    Review of Systems CNS: No confusion or sedation Cardiac: No angina or palpitations GI: No abdominal pain or constipation Constitutional: No nausea vomiting fevers or chills  Objective:  BP (!) 151/93   Pulse 97   Temp (!) 97.4 F (36.3 C)   Resp 16   Ht 6' (1.829 m)   Wt 230 lb (104.3 kg)   SpO2 99%   BMI 31.19 kg/m    BP Readings from Last 3 Encounters:  09/26/22 (!) 151/93  09/05/22 121/70  07/18/22 132/82     Wt Readings from Last 3 Encounters:  09/26/22 230 lb (104.3 kg)  09/05/22 231 lb (104.8 kg)  07/18/22 231 lb (104.8 kg)     Physical Exam Pt is alert and oriented PERRL EOMI HEART IS RRR no murmur or rub LCTA no wheezing or rales MUSCULOSKELETAL reveals some tenderness bilaterally to percussion over a well-healed midline scar that runs approximately T4-T8 region.  No  evidence of any erythema or edema around the scar is noted.  Otherwise no cutaneous findings are noted.  Muscle tone and bulk is good.  Labs  No results found for: "HGBA1C" Lab Results  Component Value Date   CREATININE 1.05 02/21/2022    -------------------------------------------------------------------------------------------------------------------- Lab Results  Component Value Date   WBC 10.1 02/21/2022   HGB 14.7 02/21/2022   HCT 43.1 02/21/2022   PLT 243 02/21/2022   GLUCOSE 95 02/21/2022   NA 138 02/21/2022   K 3.9 02/21/2022   CL 102 02/21/2022   CREATININE 1.05 02/21/2022   BUN 13 02/21/2022   CO2 27 02/21/2022    --------------------------------------------------------------------------------------------------------------------- DG  Thoracic Spine 2 View  Result Date: 09/05/2022 CLINICAL DATA:  Pain after surgery EXAM: THORACIC SPINE 2 VIEWS COMPARISON:  None Available. FINDINGS: No fracture or malalignment. Minimal degenerative disc disease. No other bony abnormalities. IMPRESSION: Minimal degenerative disc disease. Electronically Signed   By: Gerome Sam III M.D.   On: 09/05/2022 17:49     Assessment & Plan:   There are no diagnoses linked to this encounter.      ----------------------------------------------------------------------------------------------------------------------  Problem List Items Addressed This Visit   None     ----------------------------------------------------------------------------------------------------------------------  There are no diagnoses linked to this encounter.   ----------------------------------------------------------------------------------------------------------------------  I am having Darrell Kelley maintain his sertraline, EPINEPHrine, acetaminophen, escitalopram, meloxicam, methocarbamol, albuterol, traMADol, pregabalin, and lidocaine.   Meds ordered this encounter  Medications   DISCONTD: lidocaine (LIDODERM) 5 %    Sig: Place 1 patch onto the skin daily. Remove & Discard patch within 12 hours or as directed by MD    Dispense:  30 patch    Refill:  3   lidocaine (LIDODERM) 5 %    Sig: Place 1 patch onto the skin daily. Remove & Discard patch within 12 hours or as directed by MD    Dispense:  30 patch    Refill:  3   Patient's Medications  New Prescriptions   LIDOCAINE (LIDODERM) 5 %    Place 1 patch onto the skin daily. Remove & Discard patch within 12 hours or as directed by MD  Previous Medications   ACETAMINOPHEN (TYLENOL) 500 MG TABLET    Take 1,000 mg by mouth every 8 (eight) hours as needed.   ALBUTEROL (VENTOLIN HFA) 108 (90 BASE) MCG/ACT INHALER    Inhale 1 puff into the lungs every 4 (four) hours as needed.   EPINEPHRINE 0.3 MG/0.3 ML  IJ SOAJ INJECTION    Inject 0.3 mg into the muscle as needed for anaphylaxis.   ESCITALOPRAM (LEXAPRO) 10 MG TABLET    Take 5-10 mg by mouth daily.   MELOXICAM (MOBIC) 7.5 MG TABLET    Take 1 tablet (7.5 mg total) by mouth 2 (two) times daily.   METHOCARBAMOL (ROBAXIN) 500 MG TABLET    Take 1 tablet (500 mg total) by mouth every 6 (six) hours as needed for muscle spasms.   PREGABALIN (LYRICA) 25 MG CAPSULE    Take 25 mg by mouth 2 (two) times daily. He takes bid to tid prn   SERTRALINE (ZOLOFT) 100 MG TABLET    Take 200 mg by mouth daily.   TRAMADOL (ULTRAM) 50 MG TABLET    Take 1 tablet (50 mg total) by mouth every 6 (six) hours as needed for severe pain.  Modified Medications   No medications on file  Discontinued Medications   No medications on file   ---------------------------------------------------------------------------------------------------------------------- 1. DDD (degenerative disc disease),  lumbar   2. Lumbosacral spondylosis without myelopathy   3. Facet arthritis of lumbosacral region   4. Radicular pain of thoracic region   5. Right flank pain, chronic   6. Arachnoid cyst   7. Chronic pain syndrome   8. Thoracic neuralgia   He seems to be recovering well from his recent thoracic surgery.  The type of pain that he is currently reporting is more neuralgic in nature overlying the healing scar.  I think it is reasonable to start him on a lidocaine Dermal patch to be applied 12 hours on 12 hours off to see if this could give him some topical pain relief.  His other medications appear appropriate and are well received without side effect.  I do not feel he is a candidate for any other interventional therapy at this time.  No other changes in his medication regimen will be initiated.  I encouraged him to continue with TENS application and massage to this area on a more routine basis.  Continue anti-inflammatory medications as well with return to clinic scheduled in 1 month to check  on response to Lidoderm therapy. Follow-up: Return in about 1 month (around 10/26/2022) for evaluation, med refill.    Yevette Edwards, MD

## 2022-12-01 ENCOUNTER — Other Ambulatory Visit: Payer: Self-pay | Admitting: Neurosurgery

## 2022-12-01 NOTE — Telephone Encounter (Signed)
Recommend he get lyrica from his PCP or pain management as he does not have follow up with [place].

## 2022-12-03 ENCOUNTER — Encounter: Payer: Self-pay | Admitting: Anesthesiology

## 2022-12-04 ENCOUNTER — Other Ambulatory Visit: Payer: Self-pay | Admitting: *Deleted

## 2022-12-06 MED ORDER — PREGABALIN 25 MG PO CAPS: 25.0000 mg | ORAL_CAPSULE | Freq: Two times a day (BID) | ORAL | 0 refills | Status: DC

## 2022-12-12 ENCOUNTER — Other Ambulatory Visit: Payer: Self-pay

## 2022-12-29 ENCOUNTER — Other Ambulatory Visit: Payer: Self-pay | Admitting: Anesthesiology

## 2023-01-03 ENCOUNTER — Encounter: Payer: Self-pay | Admitting: Anesthesiology

## 2023-01-08 ENCOUNTER — Other Ambulatory Visit: Payer: Self-pay | Admitting: Anesthesiology

## 2023-01-08 NOTE — Telephone Encounter (Signed)
Lyrica has been sent in per Dr. Pernell Dupre.

## 2023-01-09 ENCOUNTER — Other Ambulatory Visit: Payer: Self-pay | Admitting: *Deleted

## 2023-01-09 DIAGNOSIS — M5414 Radiculopathy, thoracic region: Secondary | ICD-10-CM

## 2023-01-10 ENCOUNTER — Other Ambulatory Visit: Payer: Self-pay | Admitting: Anesthesiology

## 2023-01-10 ENCOUNTER — Telehealth: Payer: Self-pay | Admitting: Anesthesiology

## 2023-01-10 NOTE — Telephone Encounter (Signed)
Rx request send to Dr. Pernell Dupre.

## 2023-01-10 NOTE — Telephone Encounter (Signed)
Patient is calling to check on refill for pregablin ? I have him scheduled for [facility] next week but he has been out since Monday. Please check with Dr Pernell Dupre and see if he will send in script.

## 2023-01-15 ENCOUNTER — Encounter: Payer: Self-pay | Admitting: Anesthesiology

## 2023-01-15 ENCOUNTER — Ambulatory Visit: Payer: Self-pay | Attending: Anesthesiology | Admitting: Anesthesiology

## 2023-01-15 DIAGNOSIS — M546 Pain in thoracic spine: Secondary | ICD-10-CM

## 2023-01-15 DIAGNOSIS — R109 Unspecified abdominal pain: Secondary | ICD-10-CM

## 2023-01-15 DIAGNOSIS — G894 Chronic pain syndrome: Secondary | ICD-10-CM

## 2023-01-15 DIAGNOSIS — G8929 Other chronic pain: Secondary | ICD-10-CM

## 2023-01-15 DIAGNOSIS — M47817 Spondylosis without myelopathy or radiculopathy, lumbosacral region: Secondary | ICD-10-CM

## 2023-01-15 DIAGNOSIS — G930 Cerebral cysts: Secondary | ICD-10-CM

## 2023-01-15 DIAGNOSIS — M5414 Radiculopathy, thoracic region: Secondary | ICD-10-CM

## 2023-01-15 MED ORDER — LIDOCAINE 5 % EX PTCH: 1.0000 | MEDICATED_PATCH | CUTANEOUS | 0 refills | Status: AC

## 2023-01-15 MED ORDER — TRAMADOL HCL 50 MG PO TABS
50.0000 mg | ORAL_TABLET | Freq: Four times a day (QID) | ORAL | 1 refills | Status: AC | PRN
Start: 2023-01-15 — End: 2023-02-14

## 2023-01-15 NOTE — Progress Notes (Signed)
Virtual Visit via Telephone Note  I connected with Darrell Kelley on 01/15/23 at  2:40 PM EDT by telephone and verified that I am speaking with the correct person using two identifiers.  Location: Patient: Home Provider: Pain control center   I discussed the limitations, risks, security and privacy concerns of performing an evaluation and management service by telephone and the availability of in person appointments. I also discussed with the patient that there may be a patient responsible charge related to this service. The patient expressed understanding and agreed to proceed.   History of Present Illness: I spoke with Darrell Kelley via telephone as we were unable link for the video portion conference.  He reports that his low back pain and thoracic pain are stable in nature with no recent change in the quality characteristic or distribution of the pain.  He is trying to stay active do his physical therapy exercises and be productive.  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  He uses tramadol 50 mg tablets 4 breakthrough pain when his routine conservative measures and medications are ineffective.  The tramadol at 50 mg generally gives him about 50 to 60% relief lasting about 2 to 4 hours.  He averages up to 2 or 3 tablets a day when he is having a bout of severe pain but rarely averages more than about 30 tablets/month.  No side effects with the medications are noted.  Otherwise he is in his usual state of health.  No change in lower extremity strength function bowel or bladder function is noted.  Review of systems: General: No fevers or chills Pulmonary: No shortness of breath or dyspnea Cardiac: No angina or palpitations or lightheadedness GI: No abdominal pain or constipation Psych: No depression    Observations/Objective:  Current Outpatient Medications:    lidocaine (LIDODERM) 5 %, Place 1 patch onto the skin daily. Remove & Discard patch within 12 hours or  as directed by MD, Disp: 90 patch, Rfl: 0   acetaminophen (TYLENOL) 500 MG tablet, Take 1,000 mg by mouth every 8 (eight) hours as needed., Disp: , Rfl:    albuterol (VENTOLIN HFA) 108 (90 Base) MCG/ACT inhaler, Inhale 1 puff into the lungs every 4 (four) hours as needed., Disp: , Rfl:    EPINEPHrine 0.3 mg/0.3 mL IJ SOAJ injection, Inject 0.3 mg into the muscle as needed for anaphylaxis., Disp: , Rfl:    escitalopram (LEXAPRO) 10 MG tablet, Take 5-10 mg by mouth daily., Disp: , Rfl:    meloxicam (MOBIC) 7.5 MG tablet, Take 1 tablet (7.5 mg total) by mouth 2 (two) times daily., Disp: 60 tablet, Rfl: 0   methocarbamol (ROBAXIN) 500 MG tablet, Take 1 tablet (500 mg total) by mouth every 6 (six) hours as needed for muscle spasms., Disp: 120 tablet, Rfl: 1   pregabalin (LYRICA) 25 MG capsule, Take 1 capsule (25 mg total) by mouth 2 (two) times daily. He takes bid to tid prn, Disp: 60 capsule, Rfl: 0   sertraline (ZOLOFT) 100 MG tablet, Take 200 mg by mouth daily., Disp: , Rfl:    traMADol (ULTRAM) 50 MG tablet, Take 1 tablet (50 mg total) by mouth every 6 (six) hours as needed for severe pain., Disp: 90 tablet, Rfl: 1   Past Medical History:  Diagnosis Date   Anxiety    Depression    History of kidney stones    Hyperlipidemia    Wears hearing aid in both ears  Assessment and Plan: 1. Lumbosacral spondylosis without myelopathy   2. Facet arthritis of lumbosacral region   3. Radicular pain of thoracic region   4. Right flank pain, chronic   5. Arachnoid cyst   6. Chronic pain syndrome   7. Chronic bilateral thoracic back pain   Based on our discussion I think Darrell Kelley is doing reasonably well with pain control at present.  He is using tramadol appropriately and I think it is reasonable to have him continue on up to 50 mg 3 times a day for pain control.  He generally requires less than this under routine circumstances but this should be adequate for pain control.  Continue with core  stretching strengthening action exercises and physical therapy.  He can also apply the Lidoderm patch 5% on for 12 hours to the affected area off for 12 hours.  A refill for this is also generated.  Continue follow-up with his primary care physicians for baseline medical care with scheduled return to clinic in 3 months.  Follow Up Instructions:    I discussed the assessment and treatment plan with the patient. The patient was provided an opportunity to ask questions and all were answered. The patient agreed with the plan and demonstrated an understanding of the instructions.   The patient was advised to call back or seek an in-person evaluation if the symptoms worsen or if the condition fails to improve as anticipated.  I provided 25 minutes of non-face-to-face time during this encounter.   Yevette Edwards, MD

## 2023-04-29 ENCOUNTER — Encounter: Payer: Self-pay | Admitting: Anesthesiology

## 2023-04-30 ENCOUNTER — Other Ambulatory Visit: Payer: Self-pay | Admitting: *Deleted

## 2023-05-01 MED ORDER — MELOXICAM 7.5 MG PO TABS: 7.5000 mg | ORAL_TABLET | Freq: Two times a day (BID) | ORAL | 0 refills | Status: DC

## 2023-05-31 ENCOUNTER — Other Ambulatory Visit: Payer: Self-pay | Admitting: Anesthesiology

## 2023-06-04 ENCOUNTER — Encounter: Payer: Self-pay | Admitting: Anesthesiology

## 2023-06-05 ENCOUNTER — Encounter: Payer: Self-pay | Admitting: *Deleted

## 2023-06-05 ENCOUNTER — Other Ambulatory Visit: Payer: Self-pay | Admitting: *Deleted

## 2023-06-05 MED ORDER — MELOXICAM 7.5 MG PO TABS: 7.5000 mg | ORAL_TABLET | Freq: Two times a day (BID) | ORAL | 0 refills | Status: DC

## 2023-06-21 ENCOUNTER — Ambulatory Visit
Admission: EM | Admit: 2023-06-21 | Discharge: 2023-06-21 | Disposition: A | Discharge status: Home / Self Care | Attending: Family Medicine | Admitting: Family Medicine

## 2023-06-21 DIAGNOSIS — J0100 Acute maxillary sinusitis, unspecified: Secondary | ICD-10-CM

## 2023-06-21 DIAGNOSIS — R0981 Nasal congestion: Secondary | ICD-10-CM | POA: Diagnosis not present

## 2023-06-21 MED ORDER — PREDNISONE 20 MG PO TABS: ORAL_TABLET | ORAL | 0 refills | Status: AC

## 2023-06-21 MED ORDER — DOXYCYCLINE HYCLATE 100 MG PO CAPS: 100.0000 mg | ORAL_CAPSULE | Freq: Two times a day (BID) | ORAL | 0 refills | Status: AC

## 2023-06-21 NOTE — ED Triage Notes (Signed)
 Pt presents to [facility] with co of facial congestion and right sided mucous dark green for one week.

## 2023-06-21 NOTE — Discharge Instructions (Addendum)
 Advised patient to take medications as directed with food to completion.  Advised patient to take prednisone with first dose of doxycycline for the next 5 to 7 days.  Encouraged increase daily water intake to 64 ounces per day while taking these medications.  Advised if symptoms worsen and/or unresolved please call your PCP or here for further evaluation.

## 2023-06-21 NOTE — ED Provider Notes (Signed)
 [facility]    CSN: [id number] Arrival date & time: 06/21/23  6213      History   Chief Complaint Chief Complaint  Patient presents with   Nasal Congestion    HPI Darrell Kelley is a 58 y.o. male.   HPI Pleasant 58 year old male presents with facial congestion and right sided mucous with dark green sputum for 1 week.  PMH significant for obesity, anxiety, and HLD.  Past Medical History:  Diagnosis Date   Anxiety    Depression    History of kidney stones    Hyperlipidemia    Wears hearing aid in both ears     Patient Active Problem List   Diagnosis Date Noted   Intradural arachnoid cyst of spine 03/06/2022   Thoracic radiculitis 03/06/2022   Arachnoid cyst 12/13/2021   Special screening for malignant neoplasms, colon     Past Surgical History:  Procedure Laterality Date   CHOLECYSTECTOMY  2008   COLONOSCOPY WITH PROPOFOL N/A 07/28/2019   Procedure: COLONOSCOPY WITH BIOPSY;  Surgeon: Midge Minium, MD;  Location: [facility];  Service: Endoscopy;  Laterality: N/A;   LAMINECTOMY N/A 03/06/2022   Procedure: OPEN T8-9 LAMINECTOMY FOR CYST FENESTRATION;  Surgeon: Venetia Night, MD;  Location: [facility];  Service: Neurosurgery;  Laterality: N/A;   TONSILLECTOMY AND ADENOIDECTOMY  1975       Home Medications    Prior to Admission medications   Medication Sig Start Date End Date Taking? Authorizing Provider  doxycycline (VIBRAMYCIN) 100 MG capsule Take 1 capsule (100 mg total) by mouth 2 (two) times daily for 7 days. 06/21/23 06/28/23 Yes Trevor Iha, FNP  predniSONE (DELTASONE) 20 MG tablet Take 3 tabs PO daily x 5 days. 06/21/23  Yes Trevor Iha, FNP  acetaminophen (TYLENOL) 500 MG tablet Take 1,000 mg by mouth every 8 (eight) hours as needed.    [provider]  albuterol (VENTOLIN HFA) 108 (90 Base) MCG/ACT inhaler Inhale 1 puff into the lungs every 4 (four) hours as needed. 06/22/22   [provider]  EPINEPHrine  0.3 mg/0.3 mL IJ SOAJ injection Inject 0.3 mg into the muscle as needed for anaphylaxis.    [provider]  escitalopram (LEXAPRO) 10 MG tablet Take 5-10 mg by mouth daily. 02/24/22   [provider]  meloxicam (MOBIC) 7.5 MG tablet Take 1 tablet (7.5 mg total) by mouth 2 (two) times daily. 06/05/23   Yevette Edwards, MD  methocarbamol (ROBAXIN) 500 MG tablet Take 1 tablet (500 mg total) by mouth every 6 (six) hours as needed for muscle spasms. 07/11/22   Susanne Borders, PA  pregabalin (LYRICA) 25 MG capsule TAKE 1 CAPSULE (25 MG) BY MOUTH TWICE DAILY TO 3 TIMES DAILY AS NEEDED 01/16/23   Yevette Edwards, MD  sertraline (ZOLOFT) 100 MG tablet Take 200 mg by mouth daily.    [provider]    Family History Family History  Problem Relation Age of Onset   Hypertension Mother     Social History Social History   Tobacco Use   Smoking status: Never   Smokeless tobacco: Never  Vaping Use   Vaping status: Never Used  Substance Use Topics   Alcohol use: No   Drug use: No     Allergies   Egg [egg-derived products], Penicillins, Sulfa antibiotics, and Wheat   Review of Systems Review of Systems  HENT:  Positive for sinus pressure and sinus pain.   All other systems reviewed and are  negative.    Physical Exam Triage Vital Signs ED Triage Vitals [06/21/23 0832]  Encounter Vitals Group     BP      Systolic BP Percentile      Diastolic BP Percentile      Pulse      Resp      Temp      Temp src      SpO2      Weight      Height      Head Circumference      Peak Flow      Pain Score 0     Pain Loc      Pain Education      Exclude from Growth Chart    No data found.  Updated Vital Signs BP (!) 149/103   Pulse (!) 105   Temp 98.4 F (36.9 C)   Resp 16   SpO2 98%      Physical Exam Vitals and nursing note reviewed.  Constitutional:      Appearance: Normal appearance. He is obese. He is ill-appearing.  HENT:     Head: Normocephalic  and atraumatic.     Right Ear: Tympanic membrane and external ear normal.     Left Ear: Tympanic membrane and external ear normal.     Ears:     Comments: Significant eustachian tube dysfunction noted bilaterally    Nose:     Right Sinus: Maxillary sinus tenderness present.     Left Sinus: Maxillary sinus tenderness present.     Comments: Turbinates are erythematous/edematous    Mouth/Throat:     Mouth: Mucous membranes are moist.     Pharynx: Oropharynx is clear.  Eyes:     Extraocular Movements: Extraocular movements intact.     Conjunctiva/sclera: Conjunctivae normal.     Pupils: Pupils are equal, round, and reactive to light.  Cardiovascular:     Rate and Rhythm: Normal rate and regular rhythm.     Pulses: Normal pulses.     Heart sounds: Normal heart sounds.  Pulmonary:     Effort: Pulmonary effort is normal.     Breath sounds: Normal breath sounds. No wheezing, rhonchi or rales.  Musculoskeletal:        General: Normal range of motion.     Cervical back: Normal range of motion and neck supple.  Skin:    General: Skin is warm and dry.  Neurological:     General: No focal deficit present.     Mental Status: He is alert and oriented to person, place, and time. Mental status is at baseline.  Psychiatric:        Mood and Affect: Mood normal.        Behavior: Behavior normal.      UC Treatments / Results  Labs (all labs ordered are listed, but only abnormal results are displayed) Labs Reviewed - No data to display  EKG   Radiology No results found.  Procedures Procedures (including critical care time)  Medications Ordered in UC Medications - No data to display  Initial Impression / Assessment and Plan / UC Course  I have reviewed the triage vital signs and the nursing notes.  Pertinent labs & imaging results that were available during my care of the patient were reviewed by me and considered in my medical decision making (see chart for details).     MDM:  1.  Acute nonrecurrent maxillary sinusitis-Rx'd doxycycline 100 mg capsule: Take 1 capsule twice daily x  7 days; 2.  Nasal congestion-Rx'd prednisone 20 mg tablet: Take 3 tablets p.o. daily x 5 days. Advised patient to take medications as directed with food to completion.  Advised patient to take prednisone with first dose of doxycycline for the next 5 to 7 days.  Encouraged increase daily water intake to 64 ounces per day while taking these medications.  Advised if symptoms worsen and/or unresolved please call your PCP or here for further evaluation.  Final Clinical Impressions(s) / UC Diagnoses   Final diagnoses:  Acute non-recurrent maxillary sinusitis  Nasal congestion     Discharge Instructions      Advised patient to take medications as directed with food to completion.  Advised patient to take prednisone with first dose of doxycycline for the next 5 to 7 days.  Encouraged increase daily water intake to 64 ounces per day while taking these medications.  Advised if symptoms worsen and/or unresolved please call your PCP or here for further evaluation.     ED Prescriptions     Medication Sig Dispense Auth. Provider   doxycycline (VIBRAMYCIN) 100 MG capsule Take 1 capsule (100 mg total) by mouth 2 (two) times daily for 7 days. 14 capsule Trevor Iha, FNP   predniSONE (DELTASONE) 20 MG tablet Take 3 tabs PO daily x 5 days. 15 tablet Trevor Iha, FNP      PDMP not reviewed this encounter.   Trevor Iha, FNP 06/21/23 [phone]

## 2023-07-09 ENCOUNTER — Other Ambulatory Visit: Payer: Self-pay | Admitting: Anesthesiology

## 2023-07-11 ENCOUNTER — Other Ambulatory Visit: Payer: Self-pay | Admitting: Anesthesiology

## 2023-07-14 ENCOUNTER — Ambulatory Visit
Admission: EM | Admit: 2023-07-14 | Discharge: 2023-07-14 | Disposition: A | Discharge status: Home / Self Care | Attending: Family Medicine | Admitting: Family Medicine

## 2023-07-14 ENCOUNTER — Encounter: Payer: Self-pay | Admitting: Emergency Medicine

## 2023-07-14 ENCOUNTER — Telehealth: Payer: Self-pay | Admitting: Nurse Practitioner

## 2023-07-14 DIAGNOSIS — R197 Diarrhea, unspecified: Secondary | ICD-10-CM | POA: Diagnosis present

## 2023-07-14 DIAGNOSIS — R509 Fever, unspecified: Secondary | ICD-10-CM | POA: Insufficient documentation

## 2023-07-14 LAB — GASTROINTESTINAL PANEL BY PCR, STOOL (REPLACES STOOL CULTURE)

## 2023-07-14 LAB — C DIFFICILE QUICK SCREEN W PCR REFLEX
C Diff antigen: POSITIVE — AB
C Diff toxin: NEGATIVE

## 2023-07-14 LAB — CLOSTRIDIUM DIFFICILE BY PCR, REFLEXED: Toxigenic C. Difficile by PCR: POSITIVE — AB

## 2023-07-14 LAB — RESP PANEL BY RT-PCR (FLU A&B, COVID) ARPGX2
Influenza A by PCR: NEGATIVE
Influenza B by PCR: NEGATIVE
SARS Coronavirus 2 by RT PCR: NEGATIVE

## 2023-07-14 NOTE — Telephone Encounter (Signed)
 Seen today for diarrhea.  He brought back school sample within a couple hours.  I did reenter order for stool PCR and C. difficile to be collected today.

## 2023-07-14 NOTE — Discharge Instructions (Signed)
 You have tested negative for COVID and flu.  Please bring a stool sample back to the clinic so that we can send to the lab to check for any other infectious causes of your symptoms.  Continue brat diet as well as electrolyte replacement/fluids.  Lots of rest.  Please follow-up with your PCP in 2 to 3 days for recheck.  Please go to the ER if you develop any worsening symptoms.  Hope you feel better soon!

## 2023-07-14 NOTE — ED Triage Notes (Signed)
 Patient reports fever for 3 days.  Patient reports diarrhea that started on Friday.  Patient recently was treated for sinus infection and dental surgery.  Patient states that he has recently been on 2 rounds of antibiotics.

## 2023-07-14 NOTE — ED Provider Notes (Signed)
 [facility]    CSN: [id number] Arrival date & time: 07/14/23  0807      History   Chief Complaint Chief Complaint  Patient presents with   Diarrhea   Fever    HPI Darrell Kelley is a 58 y.o. male  presents for evaluation of URI symptoms for 3 days. Patient reports associated symptoms of fever 101 degrees with nonbloody diarrhea and chills. Denies nausea/vomiting, URI symptoms/sore throat, body aches.  Did state he was on 2 different antibiotics in the past month 1 with doxycycline for sinusitis with cefdinir for dental procedure never.  He last finished the cefdinir 1 week ago.  He was having some GI issues on the cefdinir but took a probiotic and felt like it seemed to improve.  3 days ago he had the sudden onset of diarrhea with a fever and chills.  Does not have any history of GI diagnoses such as Crohn's, IBS, diverticulitis.  He is able to eat and drink normally.  Reports a negative home flu and COVID test.  He has been doing probiotics. Pt has no other concerns at this time.    Diarrhea Associated symptoms: fever   Fever Associated symptoms: diarrhea     Past Medical History:  Diagnosis Date   Anxiety    Depression    History of kidney stones    Hyperlipidemia    Wears hearing aid in both ears     Patient Active Problem List   Diagnosis Date Noted   Intradural arachnoid cyst of spine 03/06/2022   Thoracic radiculitis 03/06/2022   Arachnoid cyst 12/13/2021   Special screening for malignant neoplasms, colon     Past Surgical History:  Procedure Laterality Date   CHOLECYSTECTOMY  2008   COLONOSCOPY WITH PROPOFOL N/A 07/28/2019   Procedure: COLONOSCOPY WITH BIOPSY;  Surgeon: Midge Minium, MD;  Location: [facility];  Service: Endoscopy;  Laterality: N/A;   LAMINECTOMY N/A 03/06/2022   Procedure: OPEN T8-9 LAMINECTOMY FOR CYST FENESTRATION;  Surgeon: Venetia Night, MD;  Location: [facility];  Service: Neurosurgery;  Laterality: N/A;    TONSILLECTOMY AND ADENOIDECTOMY  1975       Home Medications    Prior to Admission medications   Medication Sig Start Date End Date Taking? Authorizing Provider  acetaminophen (TYLENOL) 500 MG tablet Take 1,000 mg by mouth every 8 (eight) hours as needed.    [provider]  albuterol (VENTOLIN HFA) 108 (90 Base) MCG/ACT inhaler Inhale 1 puff into the lungs every 4 (four) hours as needed. 06/22/22   [provider]  EPINEPHrine 0.3 mg/0.3 mL IJ SOAJ injection Inject 0.3 mg into the muscle as needed for anaphylaxis.    [provider]  escitalopram (LEXAPRO) 10 MG tablet Take 5-10 mg by mouth daily. 02/24/22   [provider]  meloxicam (MOBIC) 7.5 MG tablet Take 1 tablet (7.5 mg total) by mouth 2 (two) times daily. 06/05/23   Yevette Edwards, MD  methocarbamol (ROBAXIN) 500 MG tablet Take 1 tablet (500 mg total) by mouth every 6 (six) hours as needed for muscle spasms. 07/11/22   Susanne Borders, PA  predniSONE (DELTASONE) 20 MG tablet Take 3 tabs PO daily x 5 days. 06/21/23   Trevor Iha, FNP  pregabalin (LYRICA) 25 MG capsule TAKE 1 CAPSULE (25 MG) BY MOUTH TWICE DAILY TO 3 TIMES DAILY AS NEEDED 01/16/23   Yevette Edwards, MD  sertraline (ZOLOFT) 100 MG tablet Take 200 mg by mouth daily.  [provider]    Family History Family History  Problem Relation Age of Onset   Hypertension Mother     Social History Social History   Tobacco Use   Smoking status: Never   Smokeless tobacco: Never  Vaping Use   Vaping status: Never Used  Substance Use Topics   Alcohol use: No   Drug use: No     Allergies   Egg [egg-derived products], Penicillins, Sulfa antibiotics, and Wheat   Review of Systems Review of Systems  Constitutional:  Positive for fever.  Gastrointestinal:  Positive for diarrhea.     Physical Exam Triage Vital Signs ED Triage Vitals  Encounter Vitals Group     BP 07/14/23 0824 124/78     Systolic BP Percentile --       Diastolic BP Percentile --      Pulse Rate 07/14/23 0824 (!) 103     Resp 07/14/23 0824 15     Temp 07/14/23 0824 98.4 F (36.9 C)     Temp Source 07/14/23 0824 Oral     SpO2 07/14/23 0824 95 %     Weight 07/14/23 0823 229 lb 15 oz (104.3 kg)     Height 07/14/23 0823 6' (1.829 m)     Head Circumference --      Peak Flow --      Pain Score 07/14/23 0823 5     Pain Loc --      Pain Education --      Exclude from Growth Chart --    No data found.  Updated Vital Signs BP 124/78 (BP Location: Right Arm)   Pulse (!) 103   Temp 98.4 F (36.9 C) (Oral)   Resp 15   Ht 6' (1.829 m)   Wt 229 lb 15 oz (104.3 kg)   SpO2 95%   BMI 31.19 kg/m   Visual Acuity Right Eye Distance:   Left Eye Distance:   Bilateral Distance:    Right Eye Near:   Left Eye Near:    Bilateral Near:     Physical Exam Vitals and nursing note reviewed.  Constitutional:      General: He is not in acute distress.    Appearance: Normal appearance. He is not ill-appearing.  HENT:     Head: Normocephalic and atraumatic.  Eyes:     Pupils: Pupils are equal, round, and reactive to light.  Cardiovascular:     Rate and Rhythm: Tachycardia present.     Comments: Mildly tachy at 103 Pulmonary:     Effort: Pulmonary effort is normal.  Abdominal:     General: Bowel sounds are normal.     Palpations: Abdomen is soft. There is no hepatomegaly or splenomegaly.     Tenderness: There is no abdominal tenderness. Negative signs include McBurney's sign.  Skin:    General: Skin is warm and dry.  Neurological:     General: No focal deficit present.     Mental Status: He is alert and oriented to person, place, and time.  Psychiatric:        Mood and Affect: Mood normal.        Behavior: Behavior normal.      UC Treatments / Results  Labs (all labs ordered are listed, but only abnormal results are displayed) Labs Reviewed  RESP PANEL BY RT-PCR (FLU A&B, COVID) [id number]    EKG   Radiology No results  found.  Procedures Procedures (including critical care time)  Medications Ordered in UC  Medications - No data to display  Initial Impression / Assessment and Plan / UC Course  I have reviewed the triage vital signs and the nursing notes.  Pertinent labs & imaging results that were available during my care of the patient were reviewed by me and considered in my medical decision making (see chart for details).     Reviewed exam and symptoms with patient.  No red flags.  Negative flu and COVID PCR.  Given patient recent antibiotic treatment x 2 and diarrhea we will do stool cultures/C. difficile.  Patient to bring stool sample to clinic once available as he was unable to leave a sample while here.  Discussed continuation of brat diet/electrolyte replacement/fluids.  Advised PCP follow-up 2 to 3 days for recheck.  ER precautions reviewed and patient verbalized understanding. Final Clinical Impressions(s) / UC Diagnoses   Final diagnoses:  Fever, unspecified  Diarrhea, unspecified type     Discharge Instructions      You have tested negative for COVID and flu.  Please bring a stool sample back to the clinic so that we can send to the lab to check for any other infectious causes of your symptoms.  Continue brat diet as well as electrolyte replacement/fluids.  Lots of rest.  Please follow-up with your PCP in 2 to 3 days for recheck.  Please go to the ER if you develop any worsening symptoms.  Hope you feel better soon!     ED Prescriptions   None    PDMP not reviewed this encounter.   Radford Pax, NP 07/14/23 [phone]

## 2023-07-15 ENCOUNTER — Telehealth: Payer: Self-pay | Admitting: Nurse Practitioner

## 2023-07-15 DIAGNOSIS — A0472 Enterocolitis due to Clostridium difficile, not specified as recurrent: Secondary | ICD-10-CM

## 2023-07-15 MED ORDER — FIDAXOMICIN 200 MG PO TABS: 200.0000 mg | ORAL_TABLET | Freq: Two times a day (BID) | ORAL | 0 refills | Status: AC

## 2023-07-15 NOTE — Telephone Encounter (Signed)
 Contacted patient regarding testing results.  He did verify date of birth.  Informed positive C. difficile.  Allergies reviewed.  Will start Fidaxomicin twice daily for 10 days. Advised PCP follow up next week for recheck. ER precautions reviewed and pt verbalized understanding

## 2023-07-18 ENCOUNTER — Encounter: Payer: Self-pay | Admitting: Anesthesiology

## 2023-07-18 ENCOUNTER — Other Ambulatory Visit: Payer: Self-pay | Admitting: *Deleted

## 2023-07-24 MED ORDER — MELOXICAM 7.5 MG PO TABS: 7.5000 mg | ORAL_TABLET | Freq: Two times a day (BID) | ORAL | 0 refills | Status: AC

## 2023-08-22 MED ORDER — MELOXICAM 7.5 MG PO TABS: 7.5000 mg | ORAL_TABLET | Freq: Two times a day (BID) | ORAL | 0 refills | Status: AC

## 2023-11-06 ENCOUNTER — Encounter: Payer: Self-pay | Admitting: Anesthesiology

## 2023-11-19 ENCOUNTER — Ambulatory Visit: Attending: Anesthesiology | Admitting: Anesthesiology

## 2023-11-19 ENCOUNTER — Encounter: Payer: Self-pay | Admitting: Anesthesiology

## 2023-11-19 VITALS — BP 131/97 | HR 86 | Temp 97.8°F | Resp 18 | Ht 72.0 in | Wt 230.0 lb

## 2023-11-19 DIAGNOSIS — G894 Chronic pain syndrome: Secondary | ICD-10-CM | POA: Insufficient documentation

## 2023-11-19 DIAGNOSIS — M47817 Spondylosis without myelopathy or radiculopathy, lumbosacral region: Secondary | ICD-10-CM | POA: Diagnosis present

## 2023-11-19 NOTE — Progress Notes (Signed)
 Info given to patient per Dr. Myra about [facility].

## 2023-11-20 ENCOUNTER — Other Ambulatory Visit: Payer: Self-pay | Admitting: Anesthesiology

## 2023-11-21 ENCOUNTER — Encounter: Payer: Self-pay | Admitting: Anesthesiology

## 2023-11-21 MED ORDER — TRAMADOL HCL 50 MG PO TABS: 50.0000 mg | ORAL_TABLET | Freq: Four times a day (QID) | ORAL | 2 refills | Status: DC | PRN

## 2023-11-21 NOTE — Progress Notes (Signed)
 Subjective:  Patient ID: Darrell Kelley, male    DOB: 12-Aug-1965  Age: 58 y.o. MRN: [id number]  CC: Back Pain   Procedure: None  HPI Darrell Kelley presents for reevaluation.  Darrell Kelley continues to have severe intermittent and at times unremitting midthoracic posterior back pain comparable to what he has had in the past.  He has tried conservative measures but the pain has been intense.  He has been able to restrict his utilization of the tramadol  to 25 mg up to twice a day.  He takes Tylenol  and anti-inflammatories without much success.  He has had a previous thoracic epidural without any relief.  Otherwise despite efforts at stretching physical therapy behavioral feedback modification etc. he is having limited improvement in this situation.  Outpatient Medications Prior to Visit  Medication Sig Dispense Refill   acetaminophen  (TYLENOL ) 500 MG tablet Take 1,000 mg by mouth every 8 (eight) hours as needed.     albuterol (VENTOLIN HFA) 108 (90 Base) MCG/ACT inhaler Inhale 1 puff into the lungs every 4 (four) hours as needed.     EPINEPHrine  0.3 mg/0.3 mL IJ SOAJ injection Inject 0.3 mg into the muscle as needed for anaphylaxis.     escitalopram  (LEXAPRO ) 10 MG tablet Take 5-10 mg by mouth daily.     methocarbamol  (ROBAXIN ) 500 MG tablet Take 1 tablet (500 mg total) by mouth every 6 (six) hours as needed for muscle spasms. 120 tablet 1   sertraline  (ZOLOFT ) 100 MG tablet Take 200 mg by mouth daily.     traMADol  (ULTRAM ) 50 MG tablet Take 1 tablet by mouth daily.     fidaxomicin  (DIFICID ) 200 MG TABS tablet Take 1 tablet (200 mg total) by mouth 2 (two) times daily. 20 tablet 0   meloxicam  (MOBIC ) 7.5 MG tablet Take 1 tablet (7.5 mg total) by mouth 2 (two) times daily. 60 tablet 0   meloxicam  (MOBIC ) 7.5 MG tablet Take 1 tablet (7.5 mg total) by mouth 2 (two) times daily. 60 tablet 0   predniSONE  (DELTASONE ) 20 MG tablet Take 3 tabs PO daily x 5 days. 15 tablet 0   pregabalin   (LYRICA ) 25 MG capsule TAKE 1 CAPSULE (25 MG) BY MOUTH TWICE DAILY TO 3 TIMES DAILY AS NEEDED 60 capsule 5   No facility-administered medications prior to visit.    Review of Systems CNS: No confusion or sedation Cardiac: No angina or palpitations GI: No abdominal pain or constipation Constitutional: No nausea vomiting fevers or chills  Objective:  BP (!) 131/97   Pulse 86   Temp 97.8 F (36.6 C)   Resp 18   Ht 6' (1.829 m)   Wt 230 lb (104.3 kg)   SpO2 100%   BMI 31.19 kg/m    BP Readings from Last 3 Encounters:  11/19/23 (!) 131/97  07/14/23 124/78  06/21/23 (!) 149/103     Wt Readings from Last 3 Encounters:  11/19/23 230 lb (104.3 kg)  07/14/23 229 lb 15 oz (104.3 kg)  09/26/22 230 lb (104.3 kg)     Physical Exam Pt is alert and oriented PERRL EOMI HEART IS RRR no murmur or rub LCTA no wheezing or rales MUSCULOSKELETAL reveals some paraspinous muscle tone  Labs  No results found for: HGBA1C Lab Results  Component Value Date   CREATININE 1.05 02/21/2022    -------------------------------------------------------------------------------------------------------------------- Lab Results  Component Value Date   WBC 10.1 02/21/2022   HGB 14.7 02/21/2022   HCT 43.1 02/21/2022   PLT 243  02/21/2022   GLUCOSE 95 02/21/2022   NA 138 02/21/2022   K 3.9 02/21/2022   CL 102 02/21/2022   CREATININE 1.05 02/21/2022   BUN 13 02/21/2022   CO2 27 02/21/2022    --------------------------------------------------------------------------------------------------------------------- No results found.   Assessment & Plan:   Darrell Kelley was seen today for back pain.  Diagnoses and all orders for this visit:  Lumbosacral spondylosis without myelopathy -     Ambulatory referral to Pain Clinic  Chronic pain syndrome -     Ambulatory referral to Pain  Clinic        ----------------------------------------------------------------------------------------------------------------------  Problem List Items Addressed This Visit   None Visit Diagnoses       Lumbosacral spondylosis without myelopathy    -  Primary   Relevant Orders   Ambulatory referral to Pain Clinic     Chronic pain syndrome       Relevant Medications   traMADol  (ULTRAM ) 50 MG tablet   Other Relevant Orders   Ambulatory referral to Pain Clinic         ----------------------------------------------------------------------------------------------------------------------  1. Lumbosacral spondylosis without myelopathy (Primary) At this point he has failed interventional therapy and conservative therapy.  I think he would be a candidate for a thoracic dorsal column stimulator trial.  I spoken to Dr. Lazarus about establishing this and we have given him some literature regarding the procedure and potential expectations and therapeutic benefits.  He will look to us  to set this up.  I do believe he would be a good candidate for a trial implant consideration. - Ambulatory referral to Pain Clinic  2. Chronic pain syndrome Continue with core stretching strengthening and massage in addition to behavioral feedback.  Continue tramadol  and I do feel that there is capacity for increased utilization should the pain be unremitting.  Refill will be generated today and continue follow-up with his primary care physicians for baseline medical care. - Ambulatory referral to Pain Clinic    ----------------------------------------------------------------------------------------------------------------------  I am having Darrell Kelley maintain his sertraline , EPINEPHrine , acetaminophen , escitalopram , methocarbamol , albuterol, pregabalin , meloxicam , predniSONE , fidaxomicin , meloxicam , and traMADol .   No orders of the defined types were placed in this encounter.  Patient's  Medications  New Prescriptions   No medications on file  Previous Medications   ACETAMINOPHEN  (TYLENOL ) 500 MG TABLET    Take 1,000 mg by mouth every 8 (eight) hours as needed.   ALBUTEROL (VENTOLIN HFA) 108 (90 BASE) MCG/ACT INHALER    Inhale 1 puff into the lungs every 4 (four) hours as needed.   EPINEPHRINE  0.3 MG/0.3 ML IJ SOAJ INJECTION    Inject 0.3 mg into the muscle as needed for anaphylaxis.   ESCITALOPRAM  (LEXAPRO ) 10 MG TABLET    Take 5-10 mg by mouth daily.   FIDAXOMICIN  (DIFICID ) 200 MG TABS TABLET    Take 1 tablet (200 mg total) by mouth 2 (two) times daily.   MELOXICAM  (MOBIC ) 7.5 MG TABLET    Take 1 tablet (7.5 mg total) by mouth 2 (two) times daily.   MELOXICAM  (MOBIC ) 7.5 MG TABLET    Take 1 tablet (7.5 mg total) by mouth 2 (two) times daily.   METHOCARBAMOL  (ROBAXIN ) 500 MG TABLET    Take 1 tablet (500 mg total) by mouth every 6 (six) hours as needed for muscle spasms.   PREDNISONE  (DELTASONE ) 20 MG TABLET    Take 3 tabs PO daily x 5 days.   PREGABALIN  (LYRICA ) 25 MG CAPSULE    TAKE 1 CAPSULE (25 MG)  BY MOUTH TWICE DAILY TO 3 TIMES DAILY AS NEEDED   SERTRALINE  (ZOLOFT ) 100 MG TABLET    Take 200 mg by mouth daily.   TRAMADOL  (ULTRAM ) 50 MG TABLET    Take 1 tablet by mouth daily.  Modified Medications   No medications on file  Discontinued Medications   No medications on file   ----------------------------------------------------------------------------------------------------------------------  Follow-up: No follow-ups on file.    Lynwood KANDICE Clause, MD

## 2023-12-20 ENCOUNTER — Encounter: Payer: Self-pay | Admitting: Student in an Organized Health Care Education/Training Program

## 2023-12-20 ENCOUNTER — Ambulatory Visit (HOSPITAL_BASED_OUTPATIENT_CLINIC_OR_DEPARTMENT_OTHER): Admitting: Student in an Organized Health Care Education/Training Program

## 2023-12-20 ENCOUNTER — Ambulatory Visit
Admission: RE | Admit: 2023-12-20 | Discharge: 2023-12-20 | Disposition: A | Discharge status: Home / Self Care | Source: Ambulatory Visit | Attending: Student in an Organized Health Care Education/Training Program | Admitting: Student in an Organized Health Care Education/Training Program

## 2023-12-20 ENCOUNTER — Other Ambulatory Visit: Payer: Self-pay | Admitting: Student in an Organized Health Care Education/Training Program

## 2023-12-20 VITALS — BP 102/86 | HR 101 | Temp 97.8°F | Resp 16 | Ht 72.0 in | Wt 222.0 lb

## 2023-12-20 DIAGNOSIS — M5414 Radiculopathy, thoracic region: Secondary | ICD-10-CM | POA: Insufficient documentation

## 2023-12-20 DIAGNOSIS — M5412 Radiculopathy, cervical region: Secondary | ICD-10-CM | POA: Insufficient documentation

## 2023-12-20 DIAGNOSIS — Z9889 Other specified postprocedural states: Secondary | ICD-10-CM | POA: Insufficient documentation

## 2023-12-20 DIAGNOSIS — M542 Cervicalgia: Secondary | ICD-10-CM

## 2023-12-20 DIAGNOSIS — M5134 Other intervertebral disc degeneration, thoracic region: Secondary | ICD-10-CM | POA: Insufficient documentation

## 2023-12-20 DIAGNOSIS — G894 Chronic pain syndrome: Secondary | ICD-10-CM | POA: Insufficient documentation

## 2023-12-20 MED ORDER — CELECOXIB 100 MG PO CAPS: 100.0000 mg | ORAL_CAPSULE | Freq: Two times a day (BID) | ORAL | 0 refills | Status: AC

## 2023-12-20 NOTE — Progress Notes (Signed)
 PROVIDER NOTE: Interpretation of information contained herein should be left to medically-trained personnel. Specific patient instructions are provided elsewhere under Patient Instructions section of medical record. This document was created in part using AI and STT-dictation technology, any transcriptional errors that may result from this process are unintentional.  Patient: Darrell Kelley  Service: E/M   PCP: Jeffie Cheryl BRAVO, MD  DOB: 02-May-1965  DOS: 12/20/2023  Provider: Wallie Sherry, MD  MRN: [id number]  Delivery: Face-to-face  Specialty: Interventional Pain Management  Type: Established Patient  Setting: Ambulatory outpatient facility  Specialty designation: 09  Referring Prov.: [name], MD  Location: Outpatient office facility       History of present illness (HPI) Darrell Kelley, a 58 y.o. year old male, is here today because of his Cervical radicular pain [M54.12]. Darrell Kelley primary complain today is Back Pain (Thoracic midline )   Pain Assessment: Severity of Chronic pain is reported as a 5 /10. Location: Back Mid, Medial (T8,9 cyst removal)/into abdomen r/t kidney stone pain. Onset: More than a month ago. Quality: Aching, Sharp, Discomfort, Constant, Other (Comment) (feels like he is about to have a kidney stone, similar pain). Timing: Constant. Modifying factor(s): medications. Vitals:  height is 6' (1.829 m) and weight is 222 lb (100.7 kg). His temporal temperature is 97.8 F (36.6 C). His blood pressure is 102/86 and his pulse is 101 (abnormal). His respiration is 16 and oxygen saturation is 97%.  BMI: Estimated body mass index is 30.11 kg/m as calculated from the following:   Height as of this encounter: 6' (1.829 m).   Weight as of this encounter: 222 lb (100.7 kg).   Reason for encounter:   Discussed the use of AI scribe software for clinical note transcription with the patient, who gave verbal consent to proceed.  History of Present Illness    Darrell Kelley is a 58 year old male presenting with persistent mid back pain. He was referred by Darrell Kelley for evaluation of persistent thoracic pain and consideration of a spinal cord stimulator.  He has been experiencing persistent mid back pain, initially thought to be due to kidney stones, a condition he had over twenty years ago. However, after multiple evaluations by a urologist, no kidney stones were found. The pain, which was intermittent, became more regular and persistent over time.  During the investigation of his pain, an arachnoid cyst was discovered in the thoracic region. He underwent a laminectomy at T8 and T9 to remove as much of the cyst as possible. Post-surgery, he experienced improvement and managed with minimal medication, such as tramadol  and Tylenol , until July 2025.  In early July 2025, while working at a [employer] site, he made a sudden movement to avoid stepping on freshly poured concrete. Since then, he has experienced constant pressure in his mid back, radiating to the front, described as 'internal gut pain' rather than traditional back pain. This pain sometimes causes nausea and affects his eating habits.  He also reports a new symptom of tingling and shooting pain along the side of his finger when extending his arm, which he can reproduce on command. This symptom has been present for about a month.  Prior to his surgery, he had tried steroid treatments and an epidural, which worsened his condition. He has not had an epidural since the surgery. He has tried gabapentin, Lyrica , and duloxetine in the past without success. Currently, he uses tramadol  and Tylenol  for pain management.  He has no  history of ulcers or kidney problems since passing a kidney stone over twenty years ago. He has tried meloxicam  for inflammation but found it ineffective after the recent incident.       ROS  Constitutional: Denies any fever or chills Gastrointestinal: No reported  hemesis, hematochezia, vomiting, or acute GI distress Musculoskeletal: As above Neurological: As above  Medication Review  EPINEPHrine , acetaminophen , albuterol, celecoxib , escitalopram , fidaxomicin , meloxicam , methocarbamol , predniSONE , pregabalin , sertraline , and traMADol   History Review  Allergy: Darrell Kelley is allergic to egg [egg-derived products], penicillins, sulfa antibiotics, and wheat. Drug: Darrell Kelley  reports no history of drug use. Alcohol:  reports no history of alcohol use. Tobacco:  reports that he has never smoked. He has never used smokeless tobacco. Social: Darrell Kelley  reports that he has never smoked. He has never used smokeless tobacco. He reports that he does not drink alcohol and does not use drugs. Medical:  has a past medical history of Anxiety, Depression, History of kidney stones, Hyperlipidemia, and Wears hearing aid in both ears. Surgical: Darrell Kelley  has a past surgical history that includes Tonsillectomy and adenoidectomy (1975); Cholecystectomy (2008); Colonoscopy with propofol  (N/A, 07/28/2019); and Laminectomy (N/A, 03/06/2022). Family: family history includes Hypertension in his mother.  Laboratory Chemistry Profile   Renal Lab Results  Component Value Date   BUN 13 02/21/2022   CREATININE 1.05 02/21/2022   GFRNONAA >60 02/21/2022    Hepatic No results found for: AST, ALT, ALBUMIN, ALKPHOS, HCVAB, AMYLASE, LIPASE, AMMONIA  Electrolytes Lab Results  Component Value Date   NA 138 02/21/2022   K 3.9 02/21/2022   CL 102 02/21/2022   CALCIUM 9.4 02/21/2022    Bone Lab Results  Component Value Date   TESTOFREE 9.1 02/03/2020   TESTOSTERONE  175 (L) 02/06/2020    Inflammation (CRP: Acute Phase) (ESR: Chronic Phase) No results found for: CRP, ESRSEDRATE, LATICACIDVEN       Note: Above Lab results reviewed.  Recent Imaging Review  DG PAIN CLINIC C-ARM 1-60 MIN NO REPORT Fluoro was used, but no Radiologist interpretation  will be provided.  Please refer to NOTES tab for provider progress note. Note: Reviewed        Physical Exam  Vitals: BP 102/86 (BP Location: Right Arm, Patient Position: Sitting, Cuff Size: Normal)   Pulse (!) 101   Temp 97.8 F (36.6 C) (Temporal)   Resp 16   Ht 6' (1.829 m)   Wt 222 lb (100.7 kg)   SpO2 97%   BMI 30.11 kg/m  BMI: Estimated body mass index is 30.11 kg/m as calculated from the following:   Height as of this encounter: 6' (1.829 m).   Weight as of this encounter: 222 lb (100.7 kg). Ideal: Ideal body weight: 77.6 kg (171 lb 1.2 oz) Adjusted ideal body weight: 86.8 kg (191 lb 7.1 oz) General appearance: Well nourished, well developed, and well hydrated. In no apparent acute distress Mental status: Alert, oriented x 3 (person, place, & time)       Respiratory: No evidence of acute respiratory distress Eyes: PERLA Cervical Spine Area Exam  Skin & Axial Inspection: No masses, redness, edema, swelling, or associated skin lesions Alignment: Symmetrical Functional ROM: Pain restricted ROM, to the left Stability: No instability detected Muscle Tone/Strength: Functionally intact. No obvious neuro-muscular anomalies detected. Sensory (Neurological): Dermatomal pain pattern Palpation: No palpable anomalies              Thoracic Spine Area Exam  Skin & Axial Inspection: Well healed scar  from previous spine surgery detected Alignment: Symmetrical Functional ROM: Pain restricted ROM Stability: No instability detected Muscle Tone/Strength: Functionally intact. No obvious neuro-muscular anomalies detected. Sensory (Neurological): Neurogenic pain pattern Muscle strength & Tone: No palpable anomalies  5 out of 5 strength bilateral upper extremity: Shoulder abduction, elbow flexion, elbow extension, thumb extension.  Assessment   Diagnosis  1. Cervical radicular pain   2. Radicular pain of thoracic region   3. History of thoracic surgery   4. Chronic pain syndrome    5. Other intervertebral disc degeneration, thoracic region   6. Cervicalgia      Updated Problems: Problem  Cervical Radicular Pain  History of Thoracic Surgery  Chronic Pain Syndrome  Radicular Pain of Thoracic Region    Plan of Care  Assessment and Plan    Chronic thoracic back pain after laminectomy for arachnoid cyst   Chronic thoracic back pain persists after laminectomy for an arachnoid cyst at T8-T9, worsened by a sudden movement in July.  Previous epidural steroid injections were ineffective and exacerbated symptoms. Spinal cord stimulator placement is under consideration, but there is concern about scar tissue at T8-T9 affecting lead placement. Order an MRI of the thoracic spine to assess for scar tissue and disc herniations. Consult with Doctor Clois post-MRI to evaluate the feasibility of a spinal cord stimulator. If not feasible, consider repeating a thoracic epidural. Prescribe Celebrex  100 mg twice daily for two weeks, then as needed, ensuring no concurrent NSAID use.  I was able to evaluate the patient's interlaminar windows under live fluoroscopy and they appear patent for percutaneous access  Cervical radiculopathy, LEFT upper extremity   New onset cervical radiculopathy presents with pain radiating to the  LEFT upper extremity, especially when extending the arm. Suspected cervical nerve involvement, possibly due to cervical spine issues, with no recent imaging available. Order an MRI of the cervical spine to evaluate nerve involvement and flexion-extension x-rays to assess dynamic changes. Prescribe Celebrex  as above for pain management.   I will follow-up with the patient once he has completed his imaging studies to discuss further treatment plan.        Pharmacotherapy (Medications Ordered): Meds ordered this encounter  Medications   celecoxib  (CELEBREX ) 100 MG capsule    Sig: Take 1 capsule (100 mg total) by mouth 2 (two) times daily.    Dispense:  60  capsule    Refill:  0   Orders:  Orders Placed This Encounter  Procedures   DG PAIN CLINIC C-ARM 1-60 MIN NO REPORT    Intraoperative interpretation by procedural physician at [facility].    Standing Status:   Standing    Number of Occurrences:   1    Reason for exam::   Assistance in needle guidance and placement for procedures requiring needle placement in or near specific anatomical locations not easily accessible without such assistance.   DG Cervical Spine Complete    Patient presents with axial pain with possible radicular component. Please assist us  in identifying specific level(s) and laterality of any additional findings such as: 1. Facet (Zygapophyseal) joint DJD (Hypertrophy, space narrowing, subchondral sclerosis, and/or osteophyte formation) 2. DDD and/or IVDD (Loss of disc height, desiccation, gas patterns, osteophytes, endplate sclerosis, or Black disc disease) 3. Pars defects 4. Spondylolisthesis, spondylosis, and/or spondyloarthropathies (include Degree/Grade of displacement in mm) (stability) 5. Vertebral body Fractures (acute/chronic) (state percentage of collapse) 6. Demineralization (osteopenia/osteoporotic) 7. Bone pathology 8. Foraminal narrowing  9. Surgical changes    Standing Status:  Future    Number of Occurrences:   1    Expected Date:   12/20/2023    Expiration Date:   03/20/2024    Scheduling Instructions:     Please make sure that the patient understands that this needs to be done as soon as possible. Never have the patient do the imaging just before the next appointment. Inform patient that having the imaging done within the [facility] will expedite the availability of the results and will provide      imaging availability to the requesting physician. In addition inform the patient that the imaging order has an expiration date and will not be renewed if not done within the active period.    Reason for Exam (SYMPTOM  OR DIAGNOSIS  REQUIRED):   Cervicalgia, chronic neck pain    Preferred imaging location?:   [facility]    Call Results- Best Contact Number?:   [phone] [place] [facility] Thoracic Spine 4V    Patient presents with axial pain with possible radicular component. Please assist us  in identifying specific level(s) and laterality of any additional findings such as: 1. Facet (Zygapophyseal) joint DJD (Hypertrophy, space narrowing, subchondral sclerosis, and/or osteophyte formation) 2. DDD and/or IVDD (Loss of disc height, desiccation, gas patterns, osteophytes, endplate sclerosis, or Black disc disease) 3. Pars defects 4. Spondylolisthesis, spondylosis, and/or spondyloarthropathies (include Degree/Grade of displacement in mm) (stability) 5. Vertebral body Fractures (acute/chronic) (state percentage of collapse) 6. Demineralization (osteopenia/osteoporotic) 7. Bone pathology 8. Foraminal narrowing  9. Surgical changes    Standing Status:   Future    Number of Occurrences:   1    Expiration Date:   03/20/2024    Scheduling Instructions:     Please make sure that the patient understands that this needs to be done as soon as possible. Never have the patient do the imaging just before the next appointment. Inform patient that having the imaging done within the [facility] will expedite the availability of the results and will provide      imaging availability to the requesting physician. In addition inform the patient that the imaging order has an expiration date and will not be renewed if not done within the active period.    Reason for Exam (SYMPTOM  OR DIAGNOSIS REQUIRED):   Upper back pain and/or thoracic spine pain.    Preferred imaging location?:   [facility]    Release to patient:   Immediate    Call Results- Best Contact Number?:   [phone] [place] [facility] at [facility]   MR THORACIC SPINE WO  CONTRAST    Patient presents with axial pain with possible radicular component. Please assist us  in identifying specific level(s) and laterality of any additional findings such as: 1. Facet (Zygapophyseal) joint DJD (Hypertrophy, space narrowing, subchondral sclerosis, and/or osteophyte formation) 2. DDD and/or IVDD (Loss of disc height, desiccation, gas patterns, osteophytes, endplate sclerosis, or Black disc disease) 3. Pars defects 4. Spondylolisthesis, spondylosis, and/or spondyloarthropathies (include Degree/Grade of displacement in mm) (stability) 5. Vertebral body Fractures (acute/chronic) (state percentage of collapse) 6. Demineralization (osteopenia/osteoporotic) 7. Bone pathology 8. Foraminal narrowing  9. Surgical changes 10. Central, Lateral Recess, and/or Foraminal Stenosis (include AP diameter of stenosis in mm) 11. Surgical changes (hardware type, status, and presence of fibrosis) 12. Modic Type Changes (MRI only) 13. IVDD (Disc bulge, protrusion, herniation, extrusion) (Level, laterality, extent)    Standing Status:   Future  Expiration Date:   03/20/2024    Scheduling Instructions:     Please make sure that the patient understands that this needs to be done as soon as possible. Never have the patient do the imaging just before the next appointment. Inform patient that having the imaging done within the [facility] will expedite the availability of the results and will provide      imaging availability to the requesting physician. In addition inform the patient that the imaging order has an expiration date and will not be renewed if not done within the active period.    What is the patient's sedation requirement?:   No Sedation    Does the patient have a pacemaker or implanted devices?:   No    Preferred imaging location?:   [facility] (table limit-350lbs)    Call Results- Best Contact Number?:   [phone] [place] [facility] at [facility]    Radiology Contrast Protocol - do NOT remove file path:   [email or web address]   MR CERVICAL SPINE WO CONTRAST    Patient presents with axial pain with possible radicular component. Please assist us  in identifying specific level(s) and laterality of any additional findings such as: 1. Facet (Zygapophyseal) joint DJD (Hypertrophy, space narrowing, subchondral sclerosis, and/or osteophyte formation) 2. DDD and/or IVDD (Loss of disc height, desiccation, gas patterns, osteophytes, endplate sclerosis, or Black disc disease) 3. Pars defects 4. Spondylolisthesis, spondylosis, and/or spondyloarthropathies (include Degree/Grade of displacement in mm) (stability) 5. Vertebral body Fractures (acute/chronic) (state percentage of collapse) 6. Demineralization (osteopenia/osteoporotic) 7. Bone pathology 8. Foraminal narrowing  9. Surgical changes 10. Central, Lateral Recess, and/or Foraminal Stenosis (include AP diameter of stenosis in mm) 11. Surgical changes (hardware type, status, and presence of fibrosis) 12. Modic Type Changes (MRI only) 13. IVDD (Disc bulge, protrusion, herniation, extrusion) (Level, laterality, extent)    Standing Status:   Future    Expiration Date:   03/20/2024    Scheduling Instructions:     Please make sure that the patient understands that this needs to be done as soon as possible. Never have the patient do the imaging just before the next appointment. Inform patient that having the imaging done within the [facility] will expedite the availability of the results and will provide      imaging availability to the requesting physician. In addition inform the patient that the imaging order has an expiration date and will not be renewed if not done within the active period.    What is the patient's sedation requirement?:   No Sedation    Does the patient have a pacemaker or implanted devices?:   No    Preferred imaging location?:    [facility] (table limit-350lbs)    Call Results- Best Contact Number?:   [phone] [place] [facility] at [facility]    Radiology Contrast Protocol - do NOT remove file path:   [email or web address]    Return for patient will call to schedule F2F appt prn.    Recent Visits Date Type Provider Dept  11/19/23 Office Visit [name], MD [facility]  Showing recent visits within past 90 days and meeting all other requirements Today's Visits Date Type Provider Dept  12/20/23 Office Visit Marcelino Nurse, MD [facility]  Showing today's visits and meeting all other requirements Future Appointments No visits were found meeting these conditions. Showing future appointments within next 90 days and meeting all other requirements  I discussed  the assessment and treatment plan with the patient. The patient was provided an opportunity to ask questions and all were answered. The patient agreed with the plan and demonstrated an understanding of the instructions.  Patient advised to call back or seek an in-person evaluation if the symptoms or condition worsens.  Duration of encounter: .  Total time on encounter, as per AMA guidelines included both the face-to-face and non-face-to-face time personally spent by the physician and/or other qualified health care professional(s) on the day of the encounter (includes time in activities that require the physician or other qualified health care professional and does not include time in activities normally performed by clinical staff). Physician's time may include the following activities when performed: Preparing to see the patient (e.g., pre-charting review of records, searching for previously ordered imaging, lab work, and nerve conduction tests) Review of prior analgesic pharmacotherapies. Reviewing PMP Interpreting ordered tests (e.g., lab work, imaging, nerve  conduction tests) Performing post-procedure evaluations, including interpretation of diagnostic procedures Obtaining and/or reviewing separately obtained history Performing a medically appropriate examination and/or evaluation Counseling and educating the patient/family/caregiver Ordering medications, tests, or procedures Referring and communicating with other health care professionals (when not separately reported) Documenting clinical information in the electronic or other health record Independently interpreting results (not separately reported) and communicating results to the patient/ family/caregiver Care coordination (not separately reported)  Note by: Wallie Sherry, MD (TTS and AI technology used. I apologize for any typographical errors that were not detected and corrected.) Date: 12/20/2023; Time: 3:13 PM

## 2023-12-20 NOTE — Progress Notes (Signed)
 Safety precautions to be maintained throughout the outpatient stay will include: orient to surroundings, keep bed in low position, maintain call bell within reach at all times, provide assistance with transfer out of bed and ambulation.

## 2023-12-24 ENCOUNTER — Encounter: Payer: Self-pay | Admitting: Student in an Organized Health Care Education/Training Program

## 2023-12-26 ENCOUNTER — Ambulatory Visit
Admission: RE | Admit: 2023-12-26 | Discharge: 2023-12-26 | Disposition: A | Discharge status: Home / Self Care | Source: Ambulatory Visit | Attending: Family Medicine | Admitting: Family Medicine

## 2023-12-26 ENCOUNTER — Other Ambulatory Visit: Payer: Self-pay | Admitting: Family Medicine

## 2023-12-26 DIAGNOSIS — R109 Unspecified abdominal pain: Secondary | ICD-10-CM

## 2024-01-03 ENCOUNTER — Ambulatory Visit
Admission: RE | Admit: 2024-01-03 | Discharge: 2024-01-03 | Disposition: A | Discharge status: Home / Self Care | Source: Ambulatory Visit | Attending: Student in an Organized Health Care Education/Training Program | Admitting: Student in an Organized Health Care Education/Training Program

## 2024-01-03 DIAGNOSIS — Z9889 Other specified postprocedural states: Secondary | ICD-10-CM | POA: Insufficient documentation

## 2024-01-03 DIAGNOSIS — M5134 Other intervertebral disc degeneration, thoracic region: Secondary | ICD-10-CM | POA: Diagnosis present

## 2024-01-03 DIAGNOSIS — M5414 Radiculopathy, thoracic region: Secondary | ICD-10-CM

## 2024-01-03 DIAGNOSIS — M542 Cervicalgia: Secondary | ICD-10-CM | POA: Diagnosis present

## 2024-01-03 DIAGNOSIS — M5412 Radiculopathy, cervical region: Secondary | ICD-10-CM | POA: Diagnosis present

## 2024-01-15 ENCOUNTER — Ambulatory Visit
Attending: Student in an Organized Health Care Education/Training Program | Admitting: Student in an Organized Health Care Education/Training Program

## 2024-01-15 ENCOUNTER — Encounter: Payer: Self-pay | Admitting: Student in an Organized Health Care Education/Training Program

## 2024-01-15 VITALS — BP 111/79 | HR 91 | Temp 97.9°F | Resp 16 | Ht 72.0 in | Wt 223.0 lb

## 2024-01-15 DIAGNOSIS — M546 Pain in thoracic spine: Secondary | ICD-10-CM | POA: Diagnosis present

## 2024-01-15 DIAGNOSIS — M47894 Other spondylosis, thoracic region: Secondary | ICD-10-CM | POA: Insufficient documentation

## 2024-01-15 DIAGNOSIS — G8929 Other chronic pain: Secondary | ICD-10-CM | POA: Diagnosis not present

## 2024-01-15 DIAGNOSIS — Z9889 Other specified postprocedural states: Secondary | ICD-10-CM | POA: Insufficient documentation

## 2024-01-15 NOTE — Progress Notes (Signed)
 Safety precautions to be maintained throughout the outpatient stay will include: orient to surroundings, keep bed in low position, maintain call bell within reach at all times, provide assistance with transfer out of bed and ambulation.

## 2024-01-15 NOTE — Progress Notes (Signed)
 PROVIDER NOTE: Interpretation of information contained herein should be left to medically-trained personnel. Specific patient instructions are provided elsewhere under Patient Instructions section of medical record. This document was created in part using AI and STT-dictation technology, any transcriptional errors that may result from this process are unintentional.  Patient: Darrell Kelley  Service: E/M   PCP: Darrell Cheryl BRAVO, MD  DOB: 02-Apr-1966  DOS: 01/15/2024  Provider: Wallie Sherry, MD  MRN: [id number]  Delivery: Face-to-face  Specialty: Interventional Pain Management  Type: Established Patient  Setting: Ambulatory outpatient facility  Specialty designation: 09  Referring Prov.: Darrell Cheryl BRAVO, MD  Location: Outpatient office facility       History of present illness (HPI) [name], a 58 y.o. year old male, is here today because of his Chronic bilateral thoracic back pain [M54.6, G89.29]. [name] primary complain today is Back Pain (mid)  Pertinent problems: [name] does not have any pertinent problems on file.  Pain Assessment: Severity of Chronic pain is reported as a 6 /10. Location: Back (tightnes, internal pain ,tight) Mid/adbomen, no kidney stones. Onset: More than a month ago. Quality: Aching, Constant, Discomfort, Stabbing, Sore. Timing: Constant. Modifying factor(s): medications PRN. Vitals:  height is 6' (1.829 m) and weight is 223 lb (101.2 kg). His temperature is 97.9 F (36.6 C). His blood pressure is 111/79 and his pulse is 91. His respiration is 16 and oxygen saturation is 96%.  BMI: Estimated body mass index is 30.24 kg/m as calculated from the following:   Height as of this encounter: 6' (1.829 m).   Weight as of this encounter: 223 lb (101.2 kg).  Last encounter: 12/20/2023. Last procedure: Visit date not found.  Reason for encounter:  Discussed the use of AI scribe software for clinical note transcription with the patient, who gave  verbal consent to proceed.  History of Present Illness   Darrell Kelley is a 58 year old male with a history of spine surgery who presents with mid-back pain. He was referred by Darrell Kelley for evaluation of his mid-back pain and consideration of a spinal cord stimulator.  He experiences mid-back pain located at the T8-T9 region, described as a 'tightness with a throbbing' sensation. The pain is deep, feels more internal, and sometimes radiates into his abdomen, causing nausea and loss of appetite. No shooting pain is present.  He has a history of spine surgery performed on March 06, 2022, which involved T8-T9 laminectomy.  He had an updated thoracic MRI which is detailed below.  He is currently concerned about the impact of his condition on his work, specifically his ability to lift heavy equipment, which exacerbates his pain.      HPI from initial clinic visit 12/20/2023 Darrell Kelley is a 58 year old male presenting with persistent mid back pain. He was referred by Darrell Kelley for evaluation of persistent thoracic pain and consideration of a spinal cord stimulator.   He has been experiencing persistent mid back pain, initially thought to be due to kidney stones, a condition he had over twenty years ago. However, after multiple evaluations by a urologist, no kidney stones were found. The pain, which was intermittent, became more regular and persistent over time.   During the investigation of his pain, an arachnoid cyst was discovered in the thoracic region. He underwent a laminectomy at T8 and T9 to remove as much of the cyst as possible. Post-surgery, he experienced improvement and managed with minimal medication, such as  tramadol  and Tylenol , until July 2025.   In early July 2025, while working at a [employer] site, he made a sudden movement to avoid stepping on freshly poured concrete. Since then, he has experienced constant pressure in his mid back, radiating to the front,  described as 'internal gut pain' rather than traditional back pain. This pain sometimes causes nausea and affects his eating habits.   He also reports a new symptom of tingling and shooting pain along the side of his finger when extending his arm, which he can reproduce on command. This symptom has been present for about a month.   Prior to his surgery, he had tried steroid treatments and an epidural, which worsened his condition. He has not had an epidural since the surgery. He has tried gabapentin, Lyrica , and duloxetine in the past without success. Currently, he uses tramadol  and Tylenol  for pain management.   He has no history of ulcers or kidney problems since passing a kidney stone over twenty years ago. He has tried meloxicam  for inflammation but found it ineffective after the recent incident.   ROS  Constitutional: Denies any fever or chills Gastrointestinal: No reported hemesis, hematochezia, vomiting, or acute GI distress Musculoskeletal: Midthoracic pain, more localized to the right at T8-T9 Neurological: No reported episodes of acute onset apraxia, aphasia, dysarthria, agnosia, amnesia, paralysis, loss of coordination, or loss of consciousness  Medication Review  EPINEPHrine , acetaminophen , albuterol, celecoxib , escitalopram , fidaxomicin , meloxicam , methocarbamol , predniSONE , pregabalin , and sertraline   History Review  Allergy: [name] is allergic to egg [egg-derived products], penicillins, sulfa antibiotics, and wheat. Drug: [name]  reports no history of drug use. Alcohol:  reports no history of alcohol use. Tobacco:  reports that he has never smoked. He has never used smokeless tobacco. Social: [name]  reports that he has never smoked. He has never used smokeless tobacco. He reports that he does not drink alcohol and does not use drugs. Medical:  has a past medical history of Anxiety, Depression, History of kidney stones, Hyperlipidemia, and Wears hearing aid in both  ears. Surgical: [name]  has a past surgical history that includes Tonsillectomy and adenoidectomy (1975); Cholecystectomy (2008); Colonoscopy with propofol  (N/A, 07/28/2019); and Laminectomy (N/A, 03/06/2022). Family: family history includes Hypertension in his mother.  Laboratory Chemistry Profile   Renal Lab Results  Component Value Date   BUN 13 02/21/2022   CREATININE 1.05 02/21/2022   GFRNONAA >60 02/21/2022    Hepatic No results found for: AST, ALT, ALBUMIN, ALKPHOS, HCVAB, AMYLASE, LIPASE, AMMONIA  Electrolytes Lab Results  Component Value Date   NA 138 02/21/2022   K 3.9 02/21/2022   CL 102 02/21/2022   CALCIUM 9.4 02/21/2022    Bone Lab Results  Component Value Date   TESTOFREE 9.1 02/03/2020   TESTOSTERONE  175 (L) 02/06/2020    Inflammation (CRP: Acute Phase) (ESR: Chronic Phase) No results found for: CRP, ESRSEDRATE, LATICACIDVEN       Note: Above Lab results reviewed.  Recent Imaging Review  MR THORACIC SPINE WO CONTRAST EXAM: MRI THORACIC SPINE WITHOUT INTRAVENOUS CONTRAST 01/03/2024 09:43:00 AM  TECHNIQUE: Multiplanar multisequence MRI of the thoracic spine was performed without the administration of intravenous contrast.  COMPARISON: MRI of the thoracic spine 10/03/2021 and thoracic spine myelogram 10/28/2021.  CLINICAL HISTORY: Mid-back pain; Osteoarthritis, thoracic; CNS evaluation for degenerative disease, tumor, compression or inflammation causing dysfunction. To help plan for interventional therapy, decompressive surgery, or spinal fusion, for treatment of progressively worsening recurrent or chronic symptoms. MRI cervical w/o.  Arachnoid cyst removed 2 years ago. Back pain has been worse since surgery.  FINDINGS:  BONES AND ALIGNMENT: Normal alignment. Normal vertebral body heights. Bone marrow signal: Schmorl's nodes are present along the inferior endplate of T6 and T7 and a T8-9 and T9-10. Edematous and chronic  fatty change is present at the inferior endplate of L1. A hemangioma is present anteriorly at T3. No abnormal enhancement.  SPINAL CORD: The thoracic spinal cord continues to be displaced anteriorly within the spinal canal at T7 and T8. Prominent T2 signal posterior to the spinal canal at T8 is similar to the prior study and comes to a point, a so called dorsal scalpel sign associated with a dorsal thoracic arachnoid web. No definite arachnoid cyst is present. No ventral herniation is present.  SOFT TISSUES: Unremarkable.  DEGENERATIVE CHANGES: No focal disc protrusion or other stenosis is present. The foramina are patent bilaterally.  IMPRESSION: 1. 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 No findings are most consistent with a dorsal thoracic arachnoid web. No definite arachnoid cyst or ventral herniation. 2. No spinal canal stenosis or neural foraminal narrowing in the thoracic spine.  Electronically signed by: Lonni Necessary MD 01/05/2024 01:35 PM EDT RP Workstation: [id number] MR CERVICAL SPINE WO CONTRAST EXAM: MRI CERVICAL SPINE WITHOUT CONTRAST 01/03/2024 09:42:00 AM  TECHNIQUE: Multiplanar multisequence MRI of the cervical spine was performed.  COMPARISON: None available.  CLINICAL HISTORY: Cervical radiculopathy, no red flags; Neck pain, chronic; Neck pain, chronic, degenerative changes on xray; CNS evaluation for degenerative disease, tumor, compression or inflammation causing dysfunction. To help plan for interventional therapy, decompressive surgery, or spinal fusion, for treatment of progressively worsening recurrent or chronic symptoms. MRI cervical w/o. Arachnoid cyst removed 2 years ago. Back pain has been worse since surgery.  FINDINGS:  BONES AND ALIGNMENT: Normal alignment. Normal vertebral body heights. Bone marrow signal is unremarkable.  SPINAL  CORD: Normal spinal cord size. No abnormal spinal cord signal.  SOFT TISSUES: No paraspinal mass.  C2-C3: No significant disc herniation. No spinal canal stenosis or neural foraminal narrowing.  C3-C4: No significant disc herniation. No spinal canal stenosis or neural foraminal narrowing.  C4-C5: Asymmetric right-sided intervertebral and facet hypertrophy results in moderate right and mild left foraminal stenosis at C4-C5.  C5-C6: Asymmetric right-sided uncovertebral and facet hypertrophy results in severe right and moderate left foraminal stenosis at C5-C6.  C6-C7: Mild facet hypertrophy is present at C6-C7 without significant stenosis.  C7-T1: No significant disc herniation. No spinal canal stenosis or neural foraminal narrowing.  IMPRESSION: 1. Moderate right and mild left foraminal stenosis at C4-5 due to asymmetric right-sided intervertebral and facet hypertrophy. 2. Severe right and moderate left foraminal stenosis at C5-6 due to asymmetric right-sided uncovertebral and facet hypertrophy. 3. Mild facet hypertrophy at C6-7 without significant stenosis.  Electronically signed by: Lonni Necessary MD 01/05/2024 01:23 PM EDT RP Workstation: [id number] Note: Reviewed        Physical Exam  Vitals: BP 111/79   Pulse 91   Temp 97.9 F (36.6 C)   Resp 16   Ht 6' (1.829 m)   Wt 223 lb (101.2 kg)   SpO2 96%   BMI 30.24 kg/m  BMI: Estimated body mass index is 30.24 kg/m as calculated from the following:   Height as of this encounter: 6' (1.829 m).   Weight as of this encounter: 223 lb (101.2 kg). Ideal: Ideal body weight: 77.6 kg (171  lb 1.2 oz) Adjusted ideal body weight: 87 kg (191 lb 13.5 oz) General appearance: Well nourished, well developed, and well hydrated. In no apparent acute distress Mental status: Alert, oriented x 3 (person, place, & time)       Respiratory: No evidence of acute respiratory distress Eyes: PERLA  Cervical Spine Area Exam  Skin &  Axial Inspection: No masses, redness, edema, swelling, or associated skin lesions Alignment: Symmetrical Functional ROM: Pain restricted ROM, to the left Stability: No instability detected Muscle Tone/Strength: Functionally intact. No obvious neuro-muscular anomalies detected. Sensory (Neurological): Dermatomal pain pattern Palpation: No palpable anomalies              Thoracic Spine Area Exam  Skin & Axial Inspection: Well healed scar from previous spine surgery detected Alignment: Symmetrical Functional ROM: Pain restricted ROM Stability: No instability detected Muscle Tone/Strength: Functionally intact. No obvious neuro-muscular anomalies detected. Sensory (Neurological): Neurogenic pain pattern Muscle strength & Tone: No palpable anomalies   5 out of 5 strength bilateral upper extremity: Shoulder abduction, elbow flexion, elbow extension, thumb extension.  Assessment   Diagnosis  1. Chronic bilateral thoracic back pain   2. Thoracic facet joint syndrome   3. History of thoracic surgery      Updated Problems: Problem  Chronic Bilateral Thoracic Back Pain  Thoracic Facet Joint Syndrome    Plan of Care  Darrell Kelley has a history of greater than 3 months of moderate to severe pain which is resulted in functional impairment.  The patient has tried various conservative therapeutic options such as NSAIDs, Tylenol , muscle relaxants, physical therapy which was inadequately effective.  Patient's pain is predominantly thoracic axial with physical exam findings suggestive of thoracic facet arthropathy. Thoracic facet medial branch nerve blocks were discussed with the patient.  Risks and benefits were reviewed.  Patient would like to proceed with bilateral T7, T10, T11 medial branch nerve block.  Of note he has a T8-T9 laminectomy September 2023 given history of arachnoid cyst in spine.  I also spent considerable time reviewing his thoracic MRI and advised against a thoracic spinal  cord stim trial given the presence of scar tissue.  We could consider a thoracic peripheral nerve stimulator but recommending a diagnostic thoracic facet medial branch nerve block first as we are currently doing.   No orders of the defined types were placed in this encounter.  Orders:  Orders Placed This Encounter  Procedures   THORACIC FACET BLOCK    Standing Status:   Future    Expected Date:   01/21/2024    Expiration Date:   01/14/2025    Scheduling Instructions:     Thoracic Medial Branch Block     Side: Bilateral T10, T11     Sedation: without     Timeframe: ASAA    Where will this procedure be performed?:   [facility]    Return in about 6 days (around 01/21/2024) for B/L T10, T11 thoracic facet medial branch nerveblock, in clinic [facility].    Recent Visits Date Type Provider Dept  12/20/23 Office Visit Marcelino Nurse, MD [facility]  11/19/23 Office Visit [name], MD [facility]  Showing recent visits within past 90 days and meeting all other requirements Today's Visits Date Type Provider Dept  01/15/24 Office Visit Marcelino Nurse, MD [facility]  Showing today's visits and meeting all other requirements Future Appointments No visits were found meeting these conditions. Showing future appointments within next 90 days and  meeting all other requirements  I discussed the assessment and treatment plan with the patient. The patient was provided an opportunity to ask questions and all were answered. The patient agreed with the plan and demonstrated an understanding of the instructions.  Patient advised to call back or seek an in-person evaluation if the symptoms or condition worsens. I spent a total of 30 minutes reviewing chart data, face-to-face evaluation with the patient, counseling and coordination of care as detailed above.   Note by: Darrell Sherry, MD (TTS and AI technology used. I apologize for any typographical errors that were  not detected and corrected.) Date: 01/15/2024; Time: 10:38 AM

## 2024-01-15 NOTE — Patient Instructions (Addendum)
 ______________________________________________________________________    General Risks and Possible Complications  Patient Responsibilities: It is important that you read this as it is part of your informed consent. It is our duty to inform you of the risks and possible complications associated with treatments offered to you. It is your responsibility as a patient to read this and to ask questions about anything that is not clear or that you believe was not covered in this document.  Patient's Rights: You have the right to refuse treatment. You also have the right to change your mind, even after initially having agreed to have the treatment done. However, under this last option, if you wait until the last second to change your mind, you may be charged for the materials used up to that point.  Introduction: Medicine is not an Visual merchandiser. Everything in Medicine, including the lack of treatment(s), carries the potential for danger, harm, or loss (which is by definition: Risk). In Medicine, a complication is a secondary problem, condition, or disease that can aggravate an already existing one. All treatments carry the risk of possible complications. The fact that a side effects or complications occurs, does not imply that the treatment was conducted incorrectly. It must be clearly understood that these can happen even when everything is done following the highest safety standards.  No treatment: You can choose not to proceed with the proposed treatment alternative. The "PRO(s)" would include: avoiding the risk of complications associated with the therapy. The "CON(s)" would include: not getting any of the treatment benefits. These benefits fall under one of three categories: diagnostic; therapeutic; and/or palliative. Diagnostic benefits include: getting information which can ultimately lead to improvement of the disease or symptom(s). Therapeutic benefits are those associated with the successful  treatment of the disease. Finally, palliative benefits are those related to the decrease of the primary symptoms, without necessarily curing the condition (example: decreasing the pain from a flare-up of a chronic condition, such as incurable terminal cancer).  General Risks and Complications: These are associated to most interventional treatments. They can occur alone, or in combination. They fall under one of the following six (6) categories: no benefit or worsening of symptoms; bleeding; infection; nerve damage; allergic reactions; and/or death. No benefits or worsening of symptoms: In Medicine there are no guarantees, only probabilities. No healthcare provider can ever guarantee that a medical treatment will work, they can only state the probability that it may. Furthermore, there is always the possibility that the condition may worsen, either directly, or indirectly, as a consequence of the treatment. Bleeding: This is more common if the patient is taking a blood thinner, either prescription or over the counter (example: Goody Powders, Fish oil, Aspirin, Garlic, etc.), or if suffering a condition associated with impaired coagulation (example: Hemophilia, cirrhosis of the liver, low platelet counts, etc.). However, even if you do not have one on these, it can still happen. If you have any of these conditions, or take one of these drugs, make sure to notify your treating physician. Infection: This is more common in patients with a compromised immune system, either due to disease (example: diabetes, cancer, human immunodeficiency virus [HIV], etc.), or due to medications or treatments (example: therapies used to treat cancer and rheumatological diseases). However, even if you do not have one on these, it can still happen. If you have any of these conditions, or take one of these drugs, make sure to notify your treating physician. Nerve Damage: This is more common when the treatment is  an invasive one, but it  can also happen with the use of medications, such as those used in the treatment of cancer. The damage can occur to small secondary nerves, or to large primary ones, such as those in the spinal cord and brain. This damage may be temporary or permanent and it may lead to impairments that can range from temporary numbness to permanent paralysis and/or brain death. Allergic Reactions: Any time a substance or material comes in contact with our body, there is the possibility of an allergic reaction. These can range from a mild skin rash (contact dermatitis) to a severe systemic reaction (anaphylactic reaction), which can result in death. Death: In general, any medical intervention can result in death, most of the time due to an unforeseen complication. ______________________________________________________________________      ______________________________________________________________________    P  Last  Updated: 03/20/2023 ______________________________________________________________________    Facet Blocks Patient Information  Description: The facets are joints in the spine between the vertebrae.  Like any joints in the body, facets can become irritated and painful.  Arthritis can also effect the facets.  By injecting steroids and local anesthetic in and around these joints, we can temporarily block the nerve supply to them.  Steroids act directly on irritated nerves and tissues to reduce selling and inflammation which often leads to decreased pain.  Facet blocks may be done anywhere along the spine from the neck to the low back depending upon the location of your pain.   After numbing the skin with local anesthetic (like Novocaine), a small needle is passed onto the facet joints under x-ray guidance.  You may experience a sensation of pressure while this is being done.  The entire block usually lasts about 15-25 minutes.   Conditions which may be treated by facet blocks:  Low back/buttock  pain Neck/shoulder pain Certain types of headaches  Preparation for the injection:  Do not eat any solid food or dairy products within 8 hours of your appointment. You may drink clear liquid up to 3 hours before appointment.  Clear liquids include water , black coffee, juice or soda.  No milk or cream please. You may take your regular medication, including pain medications, with a sip of water  before your appointment.  Diabetics should hold regular insulin (if taken separately) and take 1/2 normal NPH dose the morning of the procedure.  Carry some sugar containing items with you to your appointment. A driver must accompany you and be prepared to drive you home after your procedure. Bring all your current medications with you. An IV may be inserted and sedation may be given at the discretion of the physician. A blood pressure cuff, EKG and other monitors will often be applied during the procedure.  Some patients may need to have extra oxygen administered for a short period. You will be asked to provide medical information, including your allergies and medications, prior to the procedure.  We must know immediately if you are taking blood thinners (like Coumadin/Warfarin) or if you are allergic to IV iodine contrast (dye).  We must know if you could possible be pregnant.  Possible side-effects:  Bleeding from needle site Infection (rare, may require surgery) Nerve injury (rare) Numbness & tingling (temporary) Difficulty urinating (rare, temporary) Spinal headache (a headache worse with upright posture) Light-headedness (temporary) Pain at injection site (serveral days) Decreased blood pressure (rare, temporary) Weakness in arm/leg (temporary) Pressure sensation in back/neck (temporary)   Call if you experience:  Fever/chills associated with headache or increased  back/neck pain Headache worsened by an upright position New onset, weakness or numbness of an extremity below the injection  site Hives or difficulty breathing (go to the emergency room) Inflammation or drainage at the injection site(s) Severe back/neck pain greater than usual New symptoms which are concerning to you  Please note:  Although the local anesthetic injected can often make your back or neck feel good for several hours after the injection, the pain will likely return. It takes 3-7 days for steroids to work.  You may not notice any pain relief for at least one week.  If effective, we will often do a series of 2-3 injections spaced 3-6 weeks apart to maximally decrease your pain.  After the initial series, you may be a candidate for a more permanent nerve block of the facets.  If you have any questions, please call #[phone] [facility]

## 2024-01-17 IMAGING — CR DG ABDOMEN 1V
2 series · 2 of 2 positions shown · non-contrast
Comparison: Prior abdominal radiograph 10/03/2011; prior CT scan of
the abdomen and pelvis 01/08/2020

CLINICAL DATA: Right flank pain, history of kidney stones

EXAM:
ABDOMEN - 1 VIEW

[abdomen kub (1 of 2)]
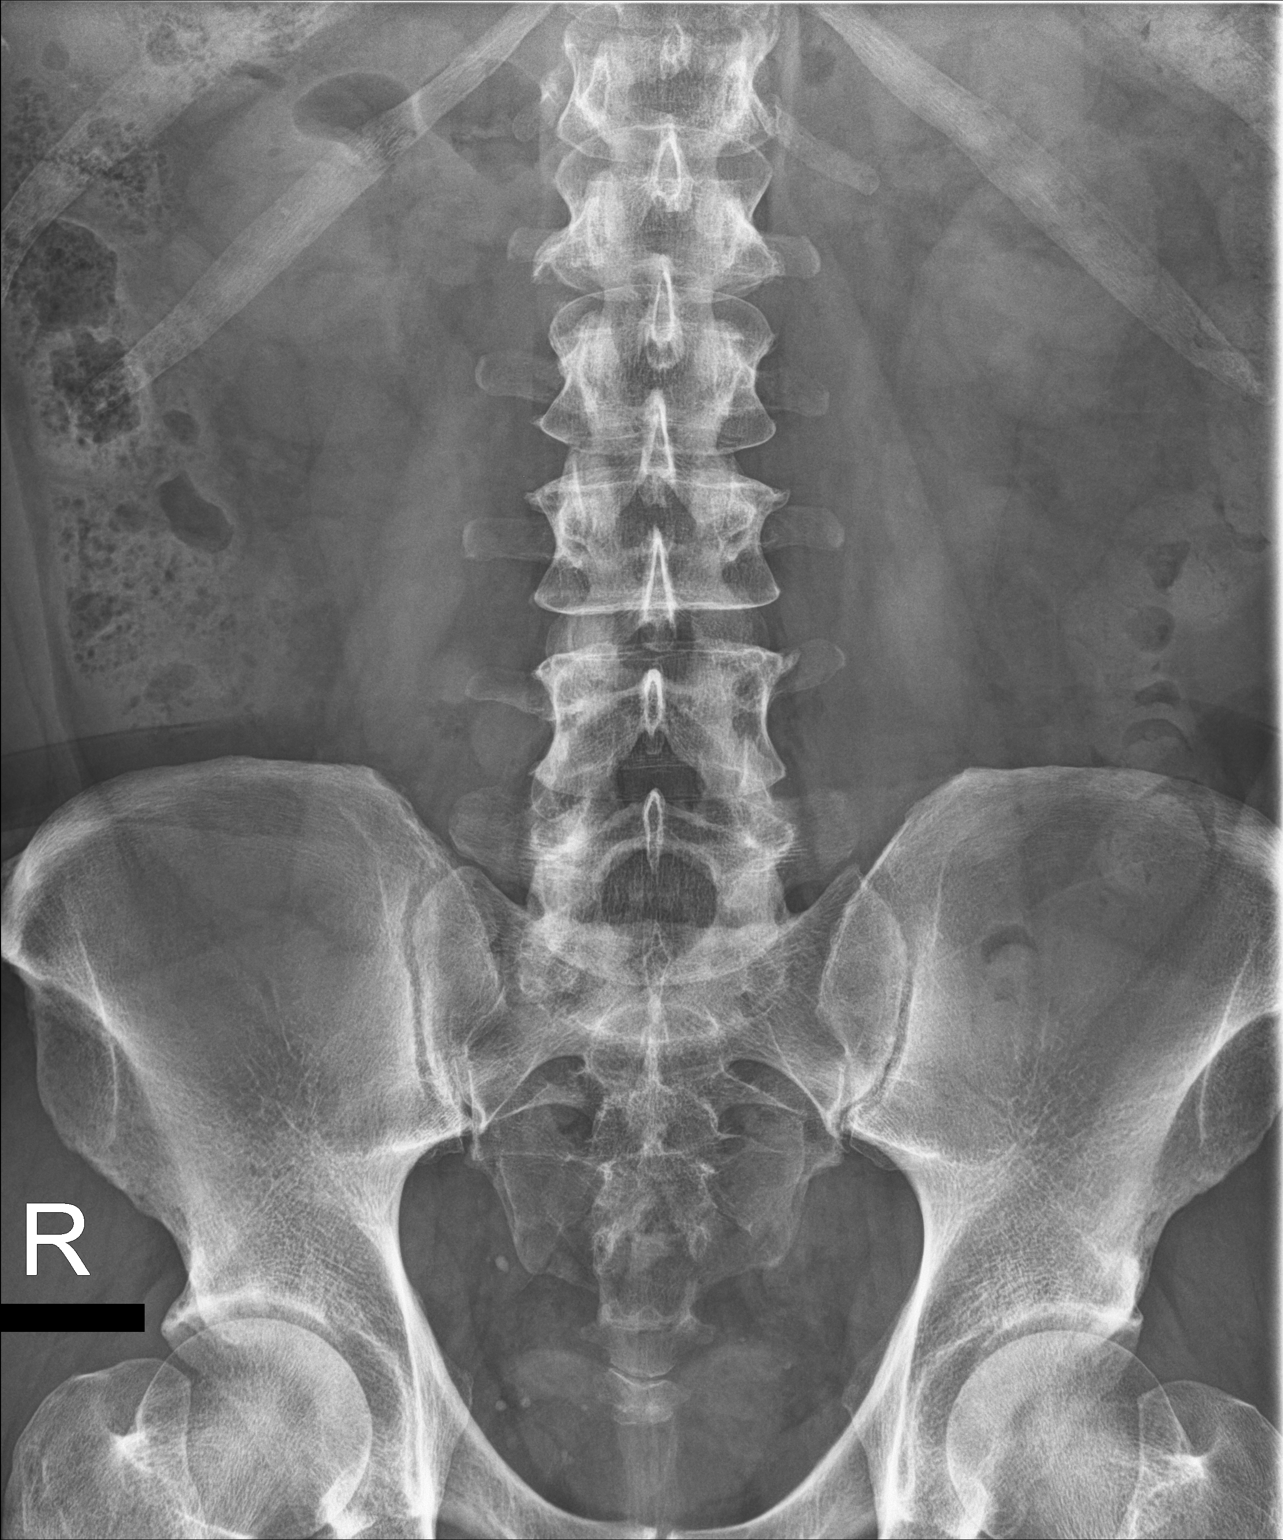

[abdomen kub (2 of 2)]
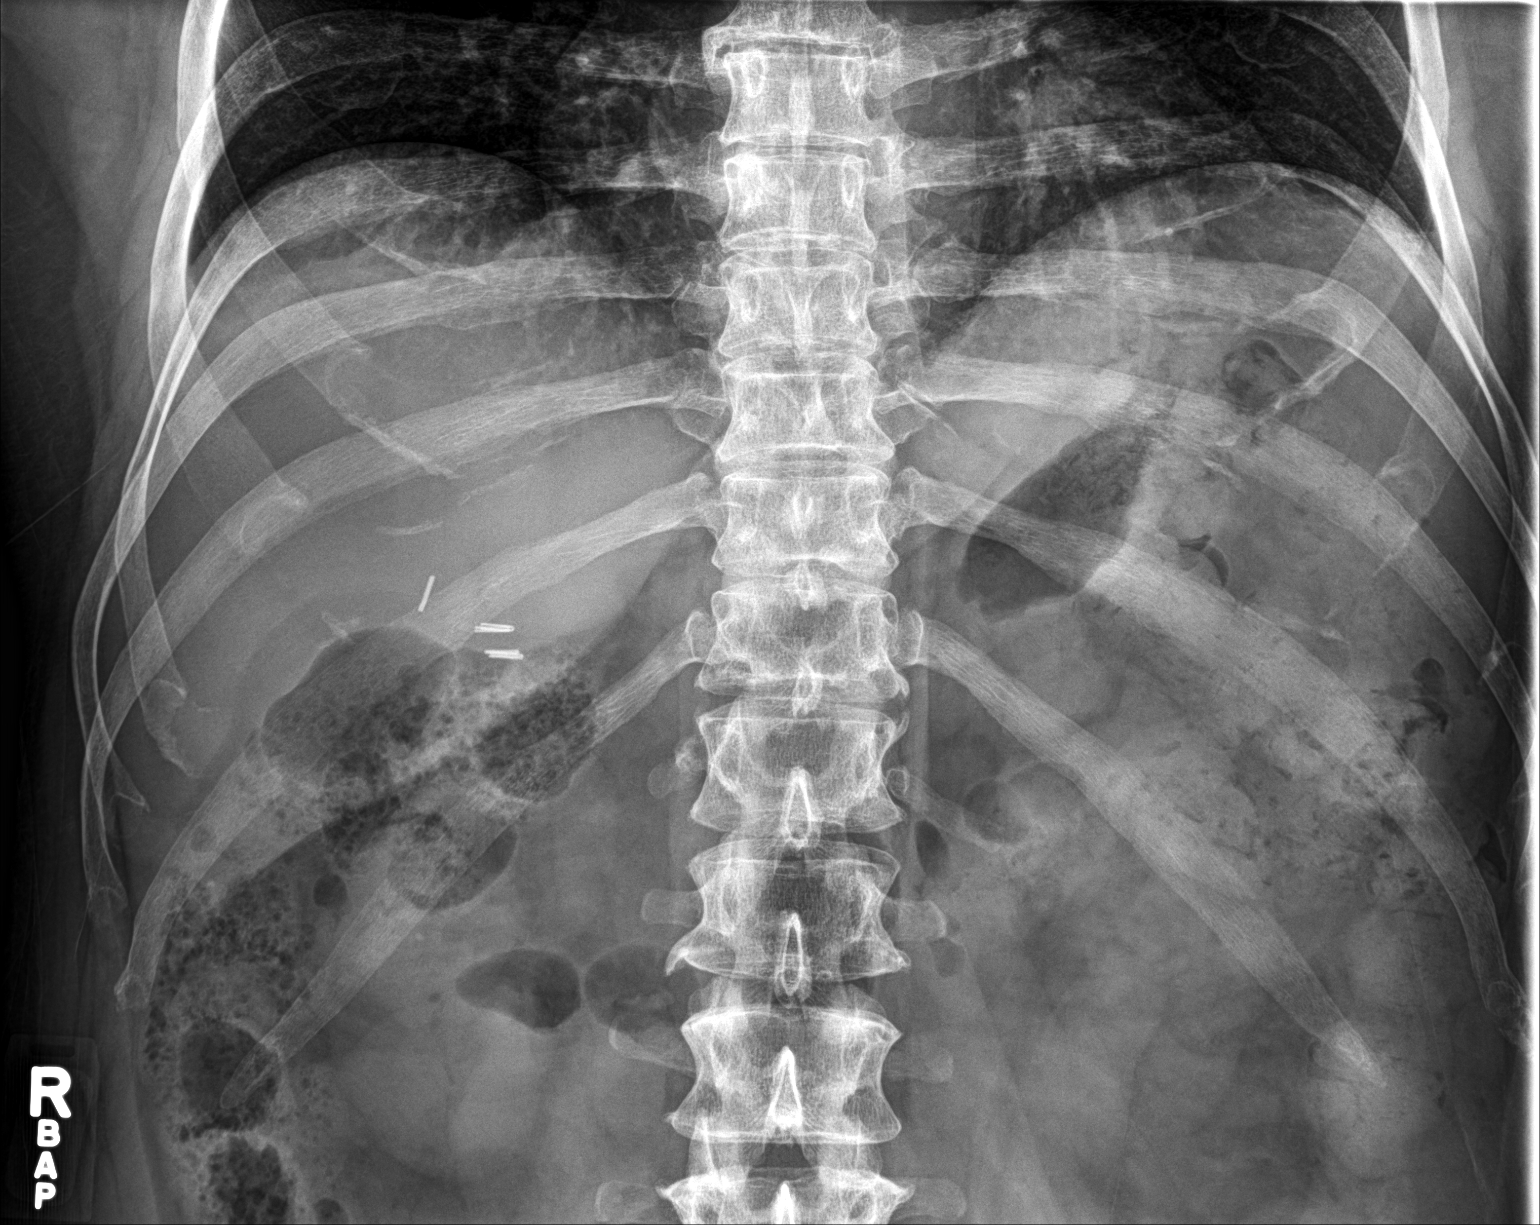

[2 of 2 positions shown; findings below may reference images not displayed]

FINDINGS: The bowel gas pattern is normal. No radio-opaque calculi or other
significant radiographic abnormality are seen. Small vascular
phleboliths project over the right aspect of the anatomic pelvis.
The largest demonstrates no interval change dating back to Friday September, 2011.
IMPRESSION: Negative.

## 2024-01-21 ENCOUNTER — Ambulatory Visit
Admission: RE | Admit: 2024-01-21 | Discharge: 2024-01-21 | Disposition: A | Discharge status: Home / Self Care | Source: Ambulatory Visit | Attending: Student in an Organized Health Care Education/Training Program | Admitting: Student in an Organized Health Care Education/Training Program

## 2024-01-21 ENCOUNTER — Ambulatory Visit (HOSPITAL_BASED_OUTPATIENT_CLINIC_OR_DEPARTMENT_OTHER): Admitting: Student in an Organized Health Care Education/Training Program

## 2024-01-21 DIAGNOSIS — M47894 Other spondylosis, thoracic region: Secondary | ICD-10-CM | POA: Diagnosis present

## 2024-01-21 DIAGNOSIS — G8929 Other chronic pain: Secondary | ICD-10-CM

## 2024-01-21 DIAGNOSIS — M546 Pain in thoracic spine: Secondary | ICD-10-CM | POA: Diagnosis present

## 2024-01-21 DIAGNOSIS — Z9889 Other specified postprocedural states: Secondary | ICD-10-CM

## 2024-01-21 MED ORDER — LIDOCAINE HCL 2 % IJ SOLN
INTRAMUSCULAR | Status: AC
  Filled 2024-01-21: qty 20

## 2024-01-21 MED ORDER — ROPIVACAINE HCL 2 MG/ML IJ SOLN
18.0000 mL | Freq: Once | INTRAMUSCULAR | Status: AC
  Administered 2024-01-21: 18 mL via PERINEURAL

## 2024-01-21 MED ORDER — ROPIVACAINE HCL 2 MG/ML IJ SOLN
INTRAMUSCULAR | Status: AC
  Filled 2024-01-21: qty 20

## 2024-01-21 MED ORDER — DEXAMETHASONE SOD PHOSPHATE PF 10 MG/ML IJ SOLN
20.0000 mg | Freq: Once | INTRAMUSCULAR | Status: AC
  Administered 2024-01-21: 20 mg

## 2024-01-21 MED ORDER — LIDOCAINE HCL 2 % IJ SOLN
20.0000 mL | Freq: Once | INTRAMUSCULAR | Status: AC
  Administered 2024-01-21: 400 mg

## 2024-01-21 NOTE — Progress Notes (Signed)
 PROVIDER NOTE: Interpretation of information contained herein should be left to medically-trained personnel. Specific patient instructions are provided elsewhere under Patient Instructions section of medical record. This document was created in part using STT-dictation technology, any transcriptional errors that may result from this process are unintentional.  Patient: Darrell Kelley Type: Established DOB: 05/19/1965 MRN: [id number] PCP: Jeffie Cheryl BRAVO, MD  Service: Procedure DOS: 01/21/2024 Setting: Ambulatory Location: Ambulatory outpatient facility Delivery: Face-to-face Provider: Wallie Sherry, MD Specialty: Interventional Pain Management Specialty designation: 09 Location: Outpatient facility Ref. Prov.: Sherry Wallie, MD       Interventional Therapy   Procedure: Thoracic facet medial branch block #1  Laterality: Bilateral (-50)  Level: T10 and T11 Medial Branch Level(s)  Imaging: Fluoroscopy-guided Spinal ([id number]) Anesthesia: Local anesthesia (1-2% Lidocaine ) DOS: 01/21/2024  Performed by: Wallie Sherry, MD  Purpose: Diagnostic/Therapeutic Indications: Thoracic back pain severe enough to impact quality of life or function. Rationale (medical necessity): procedure needed and proper for the diagnosis and/or treatment of Mr. [facility] medical symptoms and needs. 1. Chronic bilateral thoracic back pain   2. Thoracic facet joint syndrome   3. History of thoracic surgery    NAS-11 Pain score:   Pre-procedure: 6 /10   Post-procedure: 4/10     Target: Thoracic posterior (dorsal) primary rami nerve Location: 2.5 cm lateral to midline (spinous process), just cephalad to upper transverse process edge.  (needle tip placement) Region: Thoracic  Approach: Paravertebral  Type of procedure: Percutaneous perineural nerve block   Pertinent Anatomy: There are two potential fascial compartments in the TPVS: the anterior extrapleural paravertebral compartment and the posterior  subendothoracic paravertebral compartment. The transverse process and the superior costotransverse ligament form the posterior boundary.  Position / Prep / Materials:  Position: Prone  Prep solution: ChloraPrep (2% chlorhexidine  gluconate and 70% isopropyl alcohol) Prep Area: Entire upper back region Materials:  Tray: Block Needle(s):  Type: Spinal  Gauge (G): 22  Length: 3.5-in  Qty: 2  H&P (Pre-op Assessment):  Darrell Kelley is a 58 y.o. (year old), male patient, seen today for interventional treatment. He  has a past surgical history that includes Tonsillectomy and adenoidectomy (1975); Cholecystectomy (2008); Colonoscopy with propofol  (N/A, 07/28/2019); and Laminectomy (N/A, 03/06/2022). Darrell Kelley has a current medication list which includes the following prescription(s): acetaminophen , albuterol, epinephrine , escitalopram , methocarbamol , sertraline , fidaxomicin , meloxicam , meloxicam , prednisone , and pregabalin . His primarily concern today is the Back Pain (upper)  Initial Vital Signs:  Pulse/HCG Rate: 77ECG Heart Rate: 75 Temp: (!) 97.4 F (36.3 C) Resp: 18 BP: 125/86 SpO2: 100 %  BMI: Estimated body mass index is 30.11 kg/m as calculated from the following:   Height as of this encounter: 6' (1.829 m).   Weight as of this encounter: 222 lb (100.7 kg).  Risk Assessment: Allergies: Reviewed. He is allergic to egg [egg protein-containing drug products], penicillins, sulfa antibiotics, and wheat.  Allergy Precautions: None required Coagulopathies: Reviewed. None identified.  Blood-thinner therapy: None at this time Active Infection(s): Reviewed. None identified. Darrell Kelley is afebrile  Site Confirmation: Darrell Kelley was asked to confirm the procedure and laterality before marking the site Procedure checklist: Completed Consent: Before the procedure and under the influence of no sedative(s), amnesic(s), or anxiolytics, the patient was informed of the treatment options, risks and  possible complications. To fulfill our ethical and legal obligations, as recommended by the American Medical Association's Code of Ethics, I have informed the patient of my clinical impression; the nature and purpose of the treatment or procedure; the risks, benefits,  and possible complications of the intervention; the alternatives, including doing nothing; the risk(s) and benefit(s) of the alternative treatment(s) or procedure(s); and the risk(s) and benefit(s) of doing nothing. The patient was provided information about the general risks and possible complications associated with the procedure. These may include, but are not limited to: failure to achieve desired goals, infection, bleeding, organ or nerve damage, allergic reactions, paralysis, and death. In addition, the patient was informed of those risks and complications associated to Spine-related procedures, such as failure to decrease pain; infection (i.e.: Meningitis, epidural or intraspinal abscess); bleeding (i.e.: epidural hematoma, subarachnoid hemorrhage, or any other type of intraspinal or peri-dural bleeding); organ or nerve damage (i.e.: Any type of peripheral nerve, nerve root, or spinal cord injury) with subsequent damage to sensory, motor, and/or autonomic systems, resulting in permanent pain, numbness, and/or weakness of one or several areas of the body; allergic reactions; (i.e.: anaphylactic reaction); and/or death. Furthermore, the patient was informed of those risks and complications associated with the medications. These include, but are not limited to: allergic reactions (i.e.: anaphylactic or anaphylactoid reaction(s)); adrenal axis suppression; blood sugar elevation that in diabetics may result in ketoacidosis or comma; water  retention that in patients with history of congestive heart failure may result in shortness of breath, pulmonary edema, and decompensation with resultant heart failure; weight gain; swelling or edema;  medication-induced neural toxicity; particulate matter embolism and blood vessel occlusion with resultant organ, and/or nervous system infarction; and/or aseptic necrosis of one or more joints. Finally, the patient was informed that Medicine is not an exact science; therefore, there is also the possibility of unforeseen or unpredictable risks and/or possible complications that may result in a catastrophic outcome. The patient indicated having understood very clearly. We have given the patient no guarantees and we have made no promises. Enough time was given to the patient to ask questions, all of which were answered to the patient's satisfaction. Darrell Kelley has indicated that he wanted to continue with the procedure. Attestation: I, the ordering provider, attest that I have discussed with the patient the benefits, risks, side-effects, alternatives, likelihood of achieving goals, and potential problems during recovery for the procedure that I have provided informed consent. Date  Time: 01/21/2024  1:05 PM  Pre-Procedure Preparation:  Monitoring: As per clinic protocol. Respiration, ETCO2, SpO2, BP, heart rate and rhythm monitor placed and checked for adequate function Safety Precautions: Patient was assessed for positional comfort and pressure points before starting the procedure. Time-out: I initiated and conducted the Time-out before starting the procedure, as per protocol. The patient was asked to participate by confirming the accuracy of the Time Out information. Verification of the correct person, site, and procedure were performed and confirmed by me, the nursing staff, and the patient. Time-out conducted as per Joint Commission's Universal Protocol (UP.01.01.01). Time: 1348 Start Time: 1348 hrs.  Description/Narrative of Procedure:          Start Time: 1348 hrs. Technical description of procedure:  Rationale (medical necessity): procedure needed and proper for the diagnosis and/or  treatment of the patient's medical symptoms and needs. Procedural Technique Safety Precautions: Aspiration looking for blood return was conducted prior to all injections. At no point did we inject any substances, as a needle was being advanced. No attempts were made at seeking any paresthesias. Safe injection practices and needle disposal techniques used. Medications properly checked for expiration dates. SDV (single dose vial) medications used. Target Area: For the thoracic medial branch nerve, the target is located between  the top of the transverse processes at the point where the transverse process joints the vertebra and the superior-lateral aspect of the transverse process.  (More lateral towards the superior aspect of the thoracic spine.) For safety reasons and to minimize the risk of hemo- or pneumothorax, the needle tip was not advanced past the boundary of the posterior paravertebral compartment (superior costotransverse ligament). Description of the Procedure: Protocol guidelines were followed. The patient was placed in position over the fluoroscopy table. The target area was identified and the area prepped in the usual manner. Skin & deeper tissues infiltrated with local anesthetic. Appropriate amount of time allowed to pass for local anesthetics to take effect. The procedure needles were then advanced to the target area. Proper needle placement secured. Negative aspiration confirmed. Solution injected in intermittent fashion, asking for systemic symptoms every 0.5cc of injectate. The needles were then removed and the area cleansed, making sure to leave some of the prepping solution back to take advantage of its long term bactericidal properties.  2 cc of nerve block solution below injected bilaterally at T10, T11 for the medial branch nerves.  Remainder was discarded.             Facet Joint Innervation  T1-2 C8, T1  Medial Branch  T2-3 T1, T2                     T3-4 T2, T3                      T4-5 T3, T4                     T5-6 T4, T5                     T6-7 T5, T6                     T7-8 T6, T7                     T8-9 T7, T8                     T9-10 T8, T9                     T10-11 T9, T10                     T11-12 T10, T11                     T12-L1 T11, T12                       Vitals:   01/21/24 1344 01/21/24 1349 01/21/24 1354 01/21/24 1357  BP: (!) 131/92 (!) 133/100 (!) 146/103 (!) 135/93  Pulse:      Resp: 18 17 15 15   Temp:      TempSrc:      SpO2: 100% 100% 100% 100%  Weight:      Height:         End Time: 1356 hrs.  Imaging Guidance (Spinal):         Type of Imaging Technique: Fluoroscopy Guidance (Spinal) Indication(s): Fluoroscopy guidance for needle placement to enhance accuracy in procedures requiring precise needle localization for targeted delivery of medication in or near specific anatomical locations not easily accessible without such real-time imaging assistance. Exposure  Time: Please see nurses notes. Contrast: None used. Fluoroscopic Guidance: I was personally present during the use of fluoroscopy. Tunnel Vision Technique used to obtain the best possible view of the target area. Parallax error corrected before commencing the procedure. Direction-depth-direction technique used to introduce the needle under continuous pulsed fluoroscopy. Once target was reached, antero-posterior, oblique, and lateral fluoroscopic projection used confirm needle placement in all planes. Images permanently stored in EMR. Interpretation: No contrast injected. I personally interpreted the imaging intraoperatively. Adequate needle placement confirmed in multiple planes. Permanent images saved into the patient's record.  Post-operative Assessment:  Post-procedure Vital Signs:  Pulse/HCG Rate: 7780 Temp: (!) 97.4 F (36.3 C) Resp: 15 BP: (!) 135/93 SpO2: 100 %  EBL: None  Complications: No immediate post-treatment  complications observed by team, or reported by patient.  Note: The patient tolerated the entire procedure well. A repeat set of vitals were taken after the procedure and the patient was kept under observation following institutional policy, for this type of procedure. Post-procedural neurological assessment was performed, showing return to baseline, prior to discharge. The patient was provided with post-procedure discharge instructions, including a section on how to identify potential problems. Should any problems arise concerning this procedure, the patient was given instructions to immediately contact us , at any time, without hesitation. In any case, we plan to contact the patient by telephone for a follow-up status report regarding this interventional procedure.  Comments:  No additional relevant information.  Plan of Care (POC)  Orders:  Orders Placed This Encounter  Procedures   DG PAIN CLINIC C-ARM 1-60 MIN NO REPORT    Intraoperative interpretation by procedural physician at [facility].    Standing Status:   Standing    Number of Occurrences:   1    Reason for exam::   Assistance in needle guidance and placement for procedures requiring needle placement in or near specific anatomical locations not easily accessible without such assistance.     Medications ordered for procedure: Meds ordered this encounter  Medications   lidocaine  (XYLOCAINE ) 2 % (with pres) injection 400 mg   ropivacaine  (PF) 2 mg/mL (0.2%) (NAROPIN ) injection 18 mL   dexamethasone  (DECADRON ) injection 20 mg   Medications administered: We administered lidocaine , ropivacaine  (PF) 2 mg/mL (0.2%), and dexamethasone .  See the medical record for exact dosing, route, and time of administration.    Follow-up plan:   Return in about 3 weeks (around 02/11/2024) for [id number] ppe.     Recent Visits Date Type Provider Dept  01/15/24 Office Visit Marcelino Nurse, MD [facility]  12/20/23 Office Visit  Marcelino Nurse, MD [facility]  11/19/23 Office Visit Myra Lynwood MATSU, MD [facility]  Showing recent visits within past 90 days and meeting all other requirements Today's Visits Date Type Provider Dept  01/21/24 Procedure visit Marcelino Nurse, MD [facility]  Showing today's visits and meeting all other requirements Future Appointments Date Type Provider Dept  02/14/24 Appointment Marcelino Nurse, MD [facility]  Showing future appointments within next 90 days and meeting all other requirements   Disposition: Discharge home  Discharge (Date  Time): 01/21/2024; 1405 hrs.   Primary Care Physician: Jeffie Cheryl BRAVO, MD Location: [facility] Note by: Nurse Marcelino, MD (TTS technology used. I apologize for any typographical errors that were not detected and corrected.) Date: 01/21/2024; Time: 2:21 PM  Disclaimer:  Medicine is not an Visual merchandiser. The only guarantee in medicine is that nothing is  guaranteed. It is important to note that the decision to proceed with this intervention was based on the information collected from the patient. The Data and conclusions were drawn from the patient's questionnaire, the interview, and the physical examination. Because the information was provided in large part by the patient, it cannot be guaranteed that it has not been purposely or unconsciously manipulated. Every effort has been made to obtain as much relevant data as possible for this evaluation. It is important to note that the conclusions that lead to this procedure are derived in large part from the available data. Always take into account that the treatment will also be dependent on availability of resources and existing treatment guidelines, considered by other Pain Management Practitioners as being common knowledge and practice, at the time of the intervention. For Medico-Legal purposes, it is also important to point out that  variation in procedural techniques and pharmacological choices are the acceptable norm. The indications, contraindications, technique, and results of the above procedure should only be interpreted and judged by a Board-Certified Interventional Pain Specialist with extensive familiarity and expertise in the same exact procedure and technique.

## 2024-01-21 NOTE — Patient Instructions (Signed)
 ______________________________________________________________________    Post-Procedure Discharge Instructions  INSTRUCTIONS Apply ice:  Purpose: This will minimize any swelling and discomfort after procedure.  When: Day of procedure, as soon as you get home. How: Fill a plastic sandwich bag with crushed ice. Cover it with a small towel and apply to injection site. How long: (15 min on, 15 min off) Apply for 15 minutes then remove x 15 minutes.  Repeat sequence on day of procedure, until you go to bed. Apply heat:  Purpose: To treat any soreness and discomfort from the procedure. When: Starting the next day after the procedure. How: Apply heat to procedure site starting the day following the procedure. How long: May continue to repeat daily, until discomfort goes away. Food intake: Start with clear liquids (like water) and advance to regular food, as tolerated.  Physical activities: Keep activities to a minimum for the first 8 hours after the procedure. After that, then as tolerated. Driving: If you have received any sedation, be responsible and do not drive. You are not allowed to drive for 24 hours after having sedation. Blood thinner: (Applies only to those taking blood thinners) You may restart your blood thinner 6 hours after your procedure. Insulin: (Applies only to Diabetic patients taking insulin) As soon as you can eat, you may resume your normal dosing schedule. Infection prevention: Keep procedure site clean and dry. Shower daily and clean area with soap and water.  PAIN DIARY Post-procedure Pain Diary: Extremely important that this be done correctly and accurately. Recorded information will be used to determine the next step in treatment. For the purpose of accuracy, follow these rules: Evaluate only the area treated. Do not report or include pain from an untreated area. For the purpose of this evaluation, ignore all other areas of pain, except for the treated area. After your  procedure, avoid taking a long nap and attempting to complete the pain diary after you wake up. Instead, set your alarm clock to go off every hour, on the hour, for the initial 8 hours after the procedure. Document the duration of the numbing medicine, and the relief you are getting from it. Do not go to sleep and attempt to complete it later. It will not be accurate. If you received sedation, it is likely that you were given a medication that may cause amnesia. Because of this, completing the diary at a later time may cause the information to be inaccurate. This information is needed to plan your care. Follow-up appointment: Keep your post-procedure follow-up evaluation appointment after the procedure (usually 2 weeks for most procedures, 6 weeks for radiofrequencies). DO NOT FORGET to bring you pain diary with you.   EXPECT... (What should I expect to see with my procedure?) From numbing medicine (AKA: Local Anesthetics): Numbness or decrease in pain. You may also experience some weakness, which if present, could last for the duration of the local anesthetic. Onset: Full effect within 15 minutes of injected. Duration: It will depend on the type of local anesthetic used. On the average, 1 to 8 hours.  From steroids (Applies only if steroids were used): Decrease in swelling or inflammation. Once inflammation is improved, relief of the pain will follow. Onset of benefits: Depends on the amount of swelling present. The more swelling, the longer it will take for the benefits to be seen. In some cases, up to 10 days. Duration: Steroids will stay in the system x 2 weeks. Duration of benefits will depend on multiple posibilities including persistent irritating  factors. Side-effects: If present, they may typically last 2 weeks (the duration of the steroids). Frequent: Cramps (if they occur, drink Gatorade and take over-the-counter Magnesium  450-500 mg once to twice a day); water retention with temporary weight  gain; increases in blood sugar; decreased immune system response; increased appetite. Occasional: Facial flushing (red, warm cheeks); mood swings; menstrual changes. Uncommon: Long-term decrease or suppression of natural hormones; bone thinning. (These are more common with higher doses or more frequent use. This is why we prefer that our patients avoid having any injection therapies in other practices.)  Very Rare: Severe mood changes; psychosis; aseptic necrosis. From procedure: Some discomfort is to be expected once the numbing medicine wears off. This should be minimal if ice and heat are applied as instructed.  CALL IF... (When should I call?) You experience numbness and weakness that gets worse with time, as opposed to wearing off. New onset bowel or bladder incontinence. (Applies only to procedures done in the spine)  Emergency Numbers: Durning business hours (Monday - Thursday, 8:00 AM - 4:00 PM) (Friday, 9:00 AM - 12:00 Noon): [phone] After hours: [phone] NOTE: If you are having a problem and are unable connect with, or to talk to a provider, then go to your nearest urgent care or emergency department. If the problem is serious and urgent, please call [phone]. ______________________________________________________________________    Facet Blocks Patient Information  Description: The facets are joints in the spine between the vertebrae.  Like any joints in the body, facets can become irritated and painful.  Arthritis can also effect the facets.  By injecting steroids and local anesthetic in and around these joints, we can temporarily block the nerve supply to them.  Steroids act directly on irritated nerves and tissues to reduce selling and inflammation which often leads to decreased pain.  Facet blocks may be done anywhere along the spine from the neck to the low back depending upon the location of your pain.   After numbing the skin with local anesthetic (like Novocaine), a  small needle is passed onto the facet joints under x-ray guidance.  You may experience a sensation of pressure while this is being done.  The entire block usually lasts about 15-25 minutes.   Conditions which may be treated by facet blocks:  Low back/buttock pain Neck/shoulder pain Certain types of headaches  Preparation for the injection:  Do not eat any solid food or dairy products within 8 hours of your appointment. You may drink clear liquid up to 3 hours before appointment.  Clear liquids include water, black coffee, juice or soda.  No milk or cream please. You may take your regular medication, including pain medications, with a sip of water before your appointment.  Diabetics should hold regular insulin (if taken separately) and take 1/2 normal NPH dose the morning of the procedure.  Carry some sugar containing items with you to your appointment. A driver must accompany you and be prepared to drive you home after your procedure. Bring all your current medications with you. An IV may be inserted and sedation may be given at the discretion of the physician. A blood pressure cuff, EKG and other monitors will often be applied during the procedure.  Some patients may need to have extra oxygen administered for a short period. You will be asked to provide medical information, including your allergies and medications, prior to the procedure.  We must know immediately if you are taking blood thinners (like Coumadin/Warfarin) or if you are  allergic to IV iodine contrast (dye).  We must know if you could possible be pregnant.  Possible side-effects:  Bleeding from needle site Infection (rare, may require surgery) Nerve injury (rare) Numbness & tingling (temporary) Difficulty urinating (rare, temporary) Spinal headache (a headache worse with upright posture) Light-headedness (temporary) Pain at injection site (serveral days) Decreased blood pressure (rare, temporary) Weakness in arm/leg  (temporary) Pressure sensation in back/neck (temporary)   Call if you experience:  Fever/chills associated with headache or increased back/neck pain Headache worsened by an upright position New onset, weakness or numbness of an extremity below the injection site Hives or difficulty breathing (go to the emergency room) Inflammation or drainage at the injection site(s) Severe back/neck pain greater than usual New symptoms which are concerning to you  Please note:  Although the local anesthetic injected can often make your back or neck feel good for several hours after the injection, the pain will likely return. It takes 3-7 days for steroids to work.  You may not notice any pain relief for at least one week.  If effective, we will often do a series of 2-3 injections spaced 3-6 weeks apart to maximally decrease your pain.  After the initial series, you may be a candidate for a more permanent nerve block of the facets.  If you have any questions, please call #[phone] [facility]

## 2024-01-22 ENCOUNTER — Telehealth: Payer: Self-pay | Admitting: *Deleted

## 2024-01-22 ENCOUNTER — Telehealth: Payer: Self-pay

## 2024-01-22 NOTE — Telephone Encounter (Signed)
 Post procedure; called to let us  know that he is doing well. Some soreness at the site, otherwise doing well.

## 2024-01-22 NOTE — Telephone Encounter (Signed)
 Called PP. Left message on answering machine to call if needed.

## 2024-02-14 ENCOUNTER — Encounter: Payer: Self-pay | Admitting: Student in an Organized Health Care Education/Training Program

## 2024-02-14 ENCOUNTER — Ambulatory Visit
Attending: Student in an Organized Health Care Education/Training Program | Admitting: Student in an Organized Health Care Education/Training Program

## 2024-02-14 VITALS — BP 131/83 | HR 89 | Temp 97.1°F | Resp 14 | Ht 72.0 in | Wt 225.0 lb

## 2024-02-14 DIAGNOSIS — M47894 Other spondylosis, thoracic region: Secondary | ICD-10-CM | POA: Insufficient documentation

## 2024-02-14 DIAGNOSIS — Z9889 Other specified postprocedural states: Secondary | ICD-10-CM | POA: Insufficient documentation

## 2024-02-14 DIAGNOSIS — G894 Chronic pain syndrome: Secondary | ICD-10-CM | POA: Insufficient documentation

## 2024-02-14 DIAGNOSIS — G8929 Other chronic pain: Secondary | ICD-10-CM | POA: Insufficient documentation

## 2024-02-14 DIAGNOSIS — M546 Pain in thoracic spine: Secondary | ICD-10-CM | POA: Insufficient documentation

## 2024-02-14 NOTE — Patient Instructions (Addendum)
 Please give pt sprint brochure regarding [employer] and low back pain    ______________________________________________________________________    Preparing for your procedure  Appointments: If you think you may not be able to keep your appointment, call 24-48 hours in advance to cancel. We need time to make it available to others.  Procedure visits are for procedures only. During your procedure appointment there will be: NO Prescription Refills*. NO medication changes or discussions*. NO discussion of disability issues*. NO unrelated pain problem evaluations*. NO evaluations to order other pain procedures*. *These will be addressed at a separate and distinct evaluation encounter on the provider's evaluation schedule and not during procedure days.  Instructions: Food intake: Avoid eating anything solid for at least 8 hours prior to your procedure. Clear liquid intake: You may take clear liquids such as water  up to 2 hours prior to your procedure. (No carbonated drinks. No soda.) Transportation: Unless otherwise stated by your physician, bring a driver. (Driver cannot be a Market Researcher, Pharmacist, Community, or any other form of public transportation.) Morning Medicines: Except for blood thinners, take all of your other morning medications with a sip of water . Make sure to take your heart and blood pressure medicines. If your blood pressure's lower number is above 100, the case will be rescheduled. Blood thinners: Make sure to stop your blood thinners as instructed.  If you take a blood thinner, but were not instructed to stop it, call our office [phone] and ask to talk to a nurse. Not stopping a blood thinner prior to certain procedures could lead to serious complications. Diabetics on insulin: Notify the staff so that you can be scheduled 1st case in the morning. If your diabetes requires high dose insulin, take only  of your normal insulin dose the morning of the procedure and notify the staff that you have  done so. Preventing infections: Shower with an antibacterial soap the morning of your procedure.  Build-up your immune system: Take 1000 mg of Vitamin C with every meal (3 times a day) the day prior to your procedure. Antibiotics: Inform the nursing staff if you are taking any antibiotics or if you have any conditions that may require antibiotics prior to procedures. (Example: recent joint implants)   Pregnancy: If you are pregnant make sure to notify the nursing staff. Not doing so may result in injury to the fetus, including death.  Sickness: If you have a cold, fever, or any active infections, call and cancel or reschedule your procedure. Receiving steroids while having an infection may result in complications. Arrival: You must be in the facility at least 30 minutes prior to your scheduled procedure. Tardiness: Your scheduled time is also the cutoff time. If you do not arrive at least 15 minutes prior to your procedure, you will be rescheduled.  Children: Do not bring any children with you. Make arrangements to keep them home. Dress appropriately: There is always a possibility that your clothing may get soiled. Avoid long dresses. Valuables: Do not bring any jewelry or valuables.  Reasons to call and reschedule or cancel your procedure: (Following these recommendations will minimize the risk of a serious complication.) Surgeries: Avoid having procedures within 2 weeks of any surgery. (Avoid for 2 weeks before or after any surgery). Flu Shots: Avoid having procedures within 2 weeks of a flu shots or . (Avoid for 2 weeks before or after immunizations). Barium: Avoid having a procedure within 7-10 days after having had a radiological study involving the use of radiological contrast. (Myelograms, Barium  swallow or enema study). Heart attacks: Avoid any elective procedures or surgeries for the initial 6 months after a Myocardial Infarction (Heart Attack). Blood thinners: It is imperative that you  stop these medications before procedures. Let us  know if you if you take any blood thinner.  Infection: Avoid procedures during or within two weeks of an infection (including chest colds or gastrointestinal problems). Symptoms associated with infections include: Localized redness, fever, chills, night sweats or profuse sweating, burning sensation when voiding, cough, congestion, stuffiness, runny nose, sore throat, diarrhea, nausea, vomiting, cold or Flu symptoms, recent or current infections. It is specially important if the infection is over the area that we intend to treat. Heart and lung problems: Symptoms that may suggest an active cardiopulmonary problem include: cough, chest pain, breathing difficulties or shortness of breath, dizziness, ankle swelling, uncontrolled high or unusually low blood pressure, and/or palpitations. If you are experiencing any of these symptoms, cancel your procedure and contact your primary care physician for an evaluation.  Remember:  Regular Business hours are:  Monday to Thursday 8:00 AM to 4:00 PM  Provider's Schedule: Eric Como, MD:  Procedure days: Tuesday and Thursday 7:30 AM to 4:00 PM  Wallie Sherry, MD:  Procedure days: Monday and Wednesday 7:30 AM to 4:00 PM Last  Updated: 03/20/2023 ______________________________________________________________________

## 2024-02-14 NOTE — Progress Notes (Signed)
 Safety precautions to be maintained throughout the outpatient stay will include: orient to surroundings, keep bed in low position, maintain call bell within reach at all times, provide assistance with transfer out of bed and ambulation.

## 2024-02-14 NOTE — Progress Notes (Signed)
 PROVIDER NOTE: Interpretation of information contained herein should be left to medically-trained personnel. Specific patient instructions are provided elsewhere under Patient Instructions section of medical record. This document was created in part using AI and STT-dictation technology, any transcriptional errors that may result from this process are unintentional.  Patient: Darrell Kelley  Service: E/M   PCP: Jeffie Cheryl BRAVO, MD  DOB: Mar 16, 1966  DOS: 02/14/2024  Provider: Wallie Sherry, MD  MRN: [id number]  Delivery: Face-to-face  Specialty: Interventional Pain Management  Type: Established Patient  Setting: Ambulatory outpatient facility  Specialty designation: 09  Referring Prov.: Jeffie Cheryl BRAVO, MD  Location: Outpatient office facility       History of present illness (HPI) [name], a 58 y.o. year old male, is here today because of his Thoracic facet joint syndrome [M47.894]. Mr. Foiles primary complain today is Back Pain    Pain Assessment: Severity of Chronic pain is reported as a 6 /10. Location: Back Mid, Medial, Right/wraps around to upper abdoemn to mid uper abdomen. Onset: More than a month ago. Quality: Squeezing, Pressure. Timing: Constant. Modifying factor(s): Medication and rest. Vitals:  height is 6' (1.829 m) and weight is 225 lb (102.1 kg). His temporal temperature is 97.1 F (36.2 C) (abnormal). His blood pressure is 131/83 and his pulse is 89. His respiration is 14 and oxygen saturation is 97%.  BMI: Estimated body mass index is 30.52 kg/m as calculated from the following:   Height as of this encounter: 6' (1.829 m).   Weight as of this encounter: 225 lb (102.1 kg).  Last encounter: 01/15/2024. Last procedure: 01/21/2024.  Reason for encounter: post-procedure evaluation and assessment.   Discussed the use of AI scribe software for clinical note transcription with the patient, who gave verbal consent to proceed.  History of Present Illness    Darrell Kelley is a 58 year old male who presents with right-sided thoracic pain for follow-up after a nerve block.  Please see details below.  He has been experiencing significant right-sided thoracic pain, primarily located around the T11 region. The pain was previously addressed with a nerve block targeting the right T11 nerve, which provided about fifty percent relief for approximately one week before the pain gradually returned.  He is not currently on any blood thinners and did not use any sedation during the last procedure.       Post-Procedure Evaluation   Procedure: Thoracic facet medial branch block #1  Laterality: Bilateral (-50)  Level: T10 and T11 Medial Branch Level(s)  Imaging: Fluoroscopy-guided Spinal ([id number]) Anesthesia: Local anesthesia (1-2% Lidocaine ) DOS: 01/21/2024  Performed by: Wallie Sherry, MD  Purpose: Diagnostic/Therapeutic Indications: Thoracic back pain severe enough to impact quality of life or function. Rationale (medical necessity): procedure needed and proper for the diagnosis and/or treatment of Mr. [facility] medical symptoms and needs. 1. Chronic bilateral thoracic back pain   2. Thoracic facet joint syndrome   3. History of thoracic surgery    NAS-11 Pain score:   Pre-procedure: 6 /10   Post-procedure: 4/10     Effectiveness:  Initial hour after procedure: 50% Subsequent 4-6 hours post-procedure: 50 %  Analgesia past initial 6 hours: 50% Ongoing improvement:  Analgesic: 50% for 1 week Function: Back to baseline ROM: Back to baseline   ROS  Constitutional: Denies any fever or chills Gastrointestinal: No reported hemesis, hematochezia, vomiting, or acute GI distress Musculoskeletal: Mid thoracic back pain Neurological: No reported episodes of acute onset apraxia, aphasia, dysarthria, agnosia, amnesia, paralysis,  loss of coordination, or loss of consciousness  Medication Review  EPINEPHrine , acetaminophen , albuterol,  escitalopram , fidaxomicin , meloxicam , methocarbamol , predniSONE , pregabalin , and sertraline   History Review  Allergy: Mr. Rufener is allergic to egg [egg protein-containing drug products], penicillins, sulfa antibiotics, and wheat. Drug: Mr. Renstrom  reports no history of drug use. Alcohol:  reports no history of alcohol use. Tobacco:  reports that he has never smoked. He has never used smokeless tobacco. Social: Mr. Haliburton  reports that he has never smoked. He has never used smokeless tobacco. He reports that he does not drink alcohol and does not use drugs. Medical:  has a past medical history of Anxiety, Depression, History of kidney stones, Hyperlipidemia, and Wears hearing aid in both ears. Surgical: Mr. Thrush  has a past surgical history that includes Tonsillectomy and adenoidectomy (1975); Cholecystectomy (2008); Colonoscopy with propofol  (N/A, 07/28/2019); and Laminectomy (N/A, 03/06/2022). Family: family history includes Hypertension in his mother.  Laboratory Chemistry Profile   Renal Lab Results  Component Value Date   BUN 13 02/21/2022   CREATININE 1.05 02/21/2022   GFRNONAA >60 02/21/2022    Hepatic No results found for: AST, ALT, ALBUMIN, ALKPHOS, HCVAB, AMYLASE, LIPASE, AMMONIA  Electrolytes Lab Results  Component Value Date   NA 138 02/21/2022   K 3.9 02/21/2022   CL 102 02/21/2022   CALCIUM 9.4 02/21/2022    Bone Lab Results  Component Value Date   TESTOFREE 9.1 02/03/2020   TESTOSTERONE  175 (L) 02/06/2020    Inflammation (CRP: Acute Phase) (ESR: Chronic Phase) No results found for: CRP, ESRSEDRATE, LATICACIDVEN       Note: Above Lab results reviewed.  MRI THORACIC SPINE WITHOUT INTRAVENOUS CONTRAST 01/03/2024 09:43:00 AM   TECHNIQUE: Multiplanar multisequence MRI of the thoracic spine was performed without the administration of intravenous contrast.   COMPARISON: MRI of the thoracic spine 10/03/2021 and thoracic spine myelogram  10/28/2021.   CLINICAL HISTORY: Mid-back pain; Osteoarthritis, thoracic; CNS evaluation for degenerative disease, tumor, compression or inflammation causing dysfunction. To help plan for interventional therapy, decompressive surgery, or spinal fusion, for treatment of progressively worsening recurrent or chronic symptoms. MRI cervical w/o. Arachnoid cyst removed 2 years ago. Back pain has been worse since surgery.   FINDINGS:   BONES AND ALIGNMENT: Normal alignment. Normal vertebral body heights. Bone marrow signal: Schmorl's nodes are present along the inferior endplate of T6 and T7 and a T8-9 and T9-10. Edematous and chronic fatty change is present at the inferior endplate of L1. A hemangioma is present anteriorly at T3. No abnormal enhancement.   SPINAL CORD: The thoracic spinal cord continues to be displaced anteriorly within the spinal canal at T7 and T8. Prominent T2 signal posterior to the spinal canal at T8 is similar to the prior study and comes to a point, a so called dorsal scalpel sign associated with a dorsal thoracic arachnoid web. No definite arachnoid cyst is present. No ventral herniation is present.   SOFT TISSUES: Unremarkable.   DEGENERATIVE CHANGES: No focal disc protrusion or other stenosis is present. The foramina are patent bilaterally.   IMPRESSION: 1. 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 No findings are most consistent with a dorsal thoracic arachnoid web. No definite arachnoid cyst or ventral herniation. 2. No spinal canal stenosis or neural foraminal narrowing in the thoracic spine.   Electronically signed by: Lonni Necessary MD 01/05/2024 01:35 PM EDT RP Workstation: [id number]  Physical Exam  Vitals:  BP 131/83 (Patient Position: Sitting, Cuff Size: Normal)   Pulse 89   Temp (!) 97.1 F (36.2 C) (Temporal)   Resp 14   Ht 6' (1.829 m)   Wt 225 lb  (102.1 kg)   SpO2 97%   BMI 30.52 kg/m  BMI: Estimated body mass index is 30.52 kg/m as calculated from the following:   Height as of this encounter: 6' (1.829 m).   Weight as of this encounter: 225 lb (102.1 kg). Ideal: Ideal body weight: 77.6 kg (171 lb 1.2 oz) Adjusted ideal body weight: 87.4 kg (192 lb 10.3 oz) General appearance: Well nourished, well developed, and well hydrated. In no apparent acute distress Mental status: Alert, oriented x 3 (person, place, & time)       Respiratory: No evidence of acute respiratory distress Eyes: PERLA  Thoracic Spine Area Exam  Skin & Axial Inspection: Well healed scar from previous spine surgery detected Alignment: Symmetrical Functional ROM: Pain restricted ROM Stability: No instability detected Muscle Tone/Strength: Functionally intact. No obvious neuro-muscular anomalies detected. Sensory (Neurological): Musculoskeletal pain pattern Muscle strength & Tone: No palpable anomalies   5 out of 5 strength bilateral upper extremity: Shoulder abduction, elbow flexion, elbow extension, thumb extension.  Assessment   Diagnosis  1. Thoracic facet joint syndrome   2. Chronic bilateral thoracic back pain   3. History of thoracic surgery   4. Chronic pain syndrome      Updated Problems: No problems updated.  Plan of Care  58 year old male with chronic axial thoracic back pain status post T8-T9 laminectomy for arachnoid cyst returns after bilateral T10-T11 thoracic facet medial branch nerve blocks, which produced ~50% pain relief for 5 days with the greatest benefit localized to the right T11 region--findings consistent with facet-mediated pain and a positive diagnostic response. Given his partial but clinically meaningful relief, persistent functional limitation, and desire to avoid thermal lesioning at this stage, we discussed and will proceed with right T11 Sprint peripheral nerve stimulation (PNS) targeting the dorsal ramus/medial branch  region at T11 under combined fluoroscopic  ultrasound guidance. Goal is to reduce pain and improve function (sleep, activity tolerance, ADLs) over a 60-day treatment period using percutaneous micro-lead placement connected to an external pulse generator; programming will be titrated to comfortable paresthesia-free stimulation. Risks/benefits/alternatives reviewed, including infection, bleeding, lead migration, skin irritation, inadequate relief, device discomfort, and very rare nerve injury; alternatives include repeat diagnostic block, radiofrequency ablation, physical therapy, and medication optimization. Plan: reviewed recent thoracic imaging for anatomy and lead trajectory; ensure no active infection and review anticoagulation; proceed with right T11 Sprint PNS in sterile fashion, local anesthesia with minimal sedation, targeting the T11 medial branch trajectory at the junction of the transverse process and superior articular process with sensory testing to confirm coverage of concordant pain; provide post-procedure care instructions and device education; follow-up within 1-2 weeks for programming optimization and at regular intervals through 60 days; track outcomes with pain scores and functional measures; if >=50% sustained improvement, consider extended non-ablative management or, if relief wanes after explant, revisit candidacy for medial branch radiofrequency ablation.  Mr. Jovante Hammitt has a current medication list which includes the following long-term medication(s): albuterol, escitalopram , pregabalin , and sertraline .  Pharmacotherapy (Medications Ordered): No orders of the defined types were placed in this encounter.  Orders:  Orders Placed This Encounter  Procedures   Implantable Peripheral Nerve Stimulator    Standing Status:   Future    Expected Date:   04/07/2024    Expiration Date:  02/13/2025    Scheduling Instructions:     Procedure: Temporary implantable peripheral nerve  stimulator     Side: RIGHT T11 PNS     Nerve site: thoracic MBB     Sedation: IV Versed      Timeframe: ASAA    Location of Procedure:   [facility]    Return for RIGHT T11 PNS, in clinic IV Versed .    Recent Visits Date Type Provider Dept  01/21/24 Procedure visit Marcelino Nurse, MD [facility]  01/15/24 Office Visit Marcelino Nurse, MD [facility]  12/20/23 Office Visit Marcelino Nurse, MD [facility]  11/19/23 Office Visit Myra Lynwood MATSU, MD [facility]  Showing recent visits within past 90 days and meeting all other requirements Today's Visits Date Type Provider Dept  02/14/24 Office Visit Marcelino Nurse, MD [facility]  Showing today's visits and meeting all other requirements Future Appointments Date Type Provider Dept  04/07/24 Appointment Marcelino Nurse, MD [facility]  Showing future appointments within next 90 days and meeting all other requirements  I discussed the assessment and treatment plan with the patient. The patient was provided an opportunity to ask questions and all were answered. The patient agreed with the plan and demonstrated an understanding of the instructions.  Patient advised to call back or seek an in-person evaluation if the symptoms or condition worsens.  I personally spent a total of 30 minutes in the care of the patient today including preparing to see the patient, getting/reviewing separately obtained history, performing a medically appropriate exam/evaluation, counseling and educating, placing orders, and documenting clinical information in the EHR.   Note by: Nurse Marcelino, MD (TTS and AI technology used. I apologize for any typographical errors that were not detected and corrected.) Date: 02/14/2024; Time: 9:42 AM

## 2024-03-03 ENCOUNTER — Telehealth: Payer: Self-pay

## 2024-03-03 NOTE — Telephone Encounter (Signed)
 His insurance denied auth for the PNS. They are stating it is uproven, investigational and not covered. I notified the patient.

## 2024-04-07 ENCOUNTER — Ambulatory Visit: Admitting: Student in an Organized Health Care Education/Training Program

## 2024-04-09 ENCOUNTER — Other Ambulatory Visit: Payer: Self-pay | Admitting: Anesthesiology

## 2024-04-09 ENCOUNTER — Encounter: Payer: Self-pay | Admitting: *Deleted

## 2024-04-09 ENCOUNTER — Encounter: Payer: Self-pay | Admitting: Student in an Organized Health Care Education/Training Program

## 2024-05-14 ENCOUNTER — Telehealth: Payer: Self-pay | Admitting: *Deleted

## 2024-05-14 ENCOUNTER — Encounter: Payer: Self-pay | Admitting: Student in an Organized Health Care Education/Training Program

## 2024-05-14 NOTE — Telephone Encounter (Signed)
 I will look into this and call you when I figure it out. I am not at my desk today so its hard to know for sure without looking thru the paperwork I will call you as soon as I can.

## 2024-06-04 ENCOUNTER — Ambulatory Visit: Admitting: Student in an Organized Health Care Education/Training Program
